# Patient Record
Sex: Female | Born: 1995
Health system: Southern US, Community
[De-identification: ages and names within clinical notes are randomized; demographics above are authoritative.]

## PROBLEM LIST (undated history)

## (undated) DIAGNOSIS — R87629 Unspecified abnormal cytological findings in specimens from vagina: Secondary | ICD-10-CM

## (undated) DIAGNOSIS — F32A Depression, unspecified: Secondary | ICD-10-CM

## (undated) DIAGNOSIS — F419 Anxiety disorder, unspecified: Secondary | ICD-10-CM

## (undated) DIAGNOSIS — Z8619 Personal history of other infectious and parasitic diseases: Secondary | ICD-10-CM

## (undated) HISTORY — DX: Personal history of other infectious and parasitic diseases: Z86.19

## (undated) HISTORY — DX: Anxiety disorder, unspecified: F41.9

## (undated) HISTORY — DX: Depression, unspecified: F32.A

## (undated) HISTORY — DX: Unspecified abnormal cytological findings in specimens from vagina: R87.629

## (undated) HISTORY — PX: OTHER SURGICAL HISTORY: SHX169

---
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
## 2014-02-22 LAB — BASIC METABOLIC PANEL
BUN: 8 mg/dL (ref 4–21)
Creatinine: 0.8 mg/dL (ref 0.5–1.1)
GLUCOSE: 93 mg/dL
POTASSIUM: 4.2 mmol/L (ref 3.4–5.3)
SODIUM: 139 mmol/L (ref 137–147)

## 2014-02-22 LAB — TSH: TSH: 1.82 u[IU]/mL (ref 0.41–5.90)

## 2014-02-22 LAB — CBC AND DIFFERENTIAL
HCT: 40 % (ref 36–46)
Hemoglobin: 14.2 g/dL (ref 12.0–16.0)
Platelets: 175 10*3/uL (ref 150–399)
WBC: 13.5 10^3/mL

## 2014-02-22 LAB — HEPATIC FUNCTION PANEL
ALT: 6 U/L — AB (ref 7–35)
AST: 14 U/L (ref 13–35)

## 2015-03-25 ENCOUNTER — Other Ambulatory Visit: Payer: Self-pay | Admitting: Family Medicine

## 2015-03-26 NOTE — Telephone Encounter (Signed)
Please call in alprazolam. Patient is due for follow up o.v. And needs to schedule before we can approve any additional refills.

## 2015-03-26 NOTE — Telephone Encounter (Signed)
Rx faxed to pharmacy  

## 2015-04-17 ENCOUNTER — Encounter: Payer: Self-pay | Admitting: Family Medicine

## 2015-04-17 ENCOUNTER — Ambulatory Visit (INDEPENDENT_AMBULATORY_CARE_PROVIDER_SITE_OTHER): Payer: Managed Care, Other (non HMO) | Admitting: Family Medicine

## 2015-04-17 VITALS — BP 122/70 | HR 87 | Temp 98.0°F | Resp 16 | Wt 148.0 lb

## 2015-04-17 DIAGNOSIS — J309 Allergic rhinitis, unspecified: Secondary | ICD-10-CM

## 2015-04-17 DIAGNOSIS — J029 Acute pharyngitis, unspecified: Secondary | ICD-10-CM

## 2015-04-17 DIAGNOSIS — R509 Fever, unspecified: Secondary | ICD-10-CM | POA: Diagnosis not present

## 2015-04-17 DIAGNOSIS — F419 Anxiety disorder, unspecified: Secondary | ICD-10-CM | POA: Insufficient documentation

## 2015-04-17 DIAGNOSIS — F410 Panic disorder [episodic paroxysmal anxiety] without agoraphobia: Secondary | ICD-10-CM | POA: Insufficient documentation

## 2015-04-17 DIAGNOSIS — J018 Other acute sinusitis: Secondary | ICD-10-CM

## 2015-04-17 LAB — POCT INFLUENZA A/B
Influenza A, POC: NEGATIVE
Influenza B, POC: NEGATIVE

## 2015-04-17 LAB — POCT RAPID STREP A (OFFICE): Rapid Strep A Screen: NEGATIVE

## 2015-04-17 MED ORDER — HYDROCODONE-HOMATROPINE 5-1.5 MG/5ML PO SYRP: 5.0000 mL | ORAL_SOLUTION | Freq: Three times a day (TID) | ORAL | Status: DC | PRN

## 2015-04-17 MED ORDER — AMOXICILLIN 500 MG PO CAPS: 1000.0000 mg | ORAL_CAPSULE | Freq: Two times a day (BID) | ORAL | Status: AC

## 2015-04-17 MED ORDER — FLUTICASONE PROPIONATE 50 MCG/ACT NA SUSP: 2.0000 | Freq: Every day | NASAL | Status: DC

## 2015-04-17 NOTE — Progress Notes (Signed)
Patient: Michele Hunt Female    DOB: 06/01/1996   19 y.o.   MRN: [id number] Visit Date: 04/17/2015  Today's Provider: Mila Merryonald Seniah Lawrence, MD   Chief Complaint  Patient presents with  . Cough   Subjective:    Sinusitis This is a new problem. The current episode started 1 to 4 weeks ago. The problem has been gradually worsening since onset. The maximum temperature recorded prior to her arrival was 100.4 - 100.9 F. The fever has been present for 5 days or more. The pain is moderate. Associated symptoms include chills, congestion, coughing, ear pain, headaches, a hoarse voice, neck pain, shortness of breath, sinus pressure, sneezing, a sore throat and swollen glands. Past treatments include acetaminophen and oral decongestants. The treatment provided mild relief.      Allergies  Allergen Reactions  . Sertraline Hcl     upset stomach   Previous Medications   ALPRAZOLAM (XANAX) 0.5 MG TABLET    Take 1 tablet (0.5 mg total) by mouth at bedtime as needed (for panic attack).    Review of Systems  Constitutional: Positive for fever and chills.  HENT: Positive for congestion, ear discharge, ear pain, hoarse voice, sinus pressure, sneezing and sore throat.   Respiratory: Positive for cough, shortness of breath and wheezing.   Musculoskeletal: Positive for neck pain.  Neurological: Positive for headaches.    Social History  Substance Use Topics  . Smoking status: Current Every Day Smoker -- 0.75 packs/day    Types: Cigarettes  . Smokeless tobacco: Not on file  . Alcohol Use: 0.0 oz/week    0 Standard drinks or equivalent per week     Comment: occasional   Objective:   BP 122/70 mmHg  Pulse 87  Temp(Src) 98 F (36.7 C) (Oral)  Resp 16  Wt 148 lb (67.132 kg)  SpO2 98%  LMP 03/18/2015  Physical Exam  General Appearance:    Alert, cooperative, no distress  HENT:   bilateral TM normal without fluid or infection, neck has bilateral anterior cervical nodes enlarged, pharynx  erythematous without exudate and maxillary sinus tender  Eyes:    PERRL, conjunctiva/corneas clear, EOM's intact       Lungs:     Clear to auscultation bilaterally, respirations unlabored  Heart:    Regular rate and rhythm  Neurologic:   Awake, alert, oriented x 3. No apparent focal neurological           defect.       Results for orders placed or performed in visit on 04/17/15  POCT Influenza A/B  Result Value Ref Range   Influenza A, POC Negative Negative   Influenza B, POC Negative Negative  POCT rapid strep A  Result Value Ref Range   Rapid Strep A Screen Negative Negative       Assessment & Plan:     1. Fever, unspecified fever cause  - POCT Influenza A/B - POCT rapid strep A  2. Sore throat  - POCT Influenza A/B - POCT rapid strep A  3. Other acute sinusitis  - amoxicillin (AMOXIL) 500 MG capsule; Take 2 capsules (1,000 mg total) by mouth 2 (two) times daily.  Dispense: 40 capsule; Refill: 0 - HYDROcodone-homatropine (HYCODAN) 5-1.5 MG/5ML syrup; Take 5 mLs by mouth every 8 (eight) hours as needed for cough.  Dispense: 120 mL; Refill: 0 - fluticasone (FLONASE) 50 MCG/ACT nasal spray; Place 2 sprays into both nostrils daily.  Dispense: 16 g; Refill: 6  4. Allergic rhinitis, unspecified allergic rhinitis type         Mila Merry, MD  [facility]

## 2015-05-01 ENCOUNTER — Encounter: Payer: Self-pay | Admitting: Family Medicine

## 2015-05-01 ENCOUNTER — Ambulatory Visit (INDEPENDENT_AMBULATORY_CARE_PROVIDER_SITE_OTHER): Payer: Managed Care, Other (non HMO) | Admitting: Family Medicine

## 2015-05-01 VITALS — BP 120/70 | HR 86 | Temp 98.0°F | Resp 16 | Wt 150.0 lb

## 2015-05-01 DIAGNOSIS — J029 Acute pharyngitis, unspecified: Secondary | ICD-10-CM | POA: Diagnosis not present

## 2015-05-01 DIAGNOSIS — J309 Allergic rhinitis, unspecified: Secondary | ICD-10-CM

## 2015-05-01 MED ORDER — MONTELUKAST SODIUM 10 MG PO TABS: 10.0000 mg | ORAL_TABLET | Freq: Every day | ORAL | Status: DC

## 2015-05-01 NOTE — Progress Notes (Signed)
Patient: Michele HummingbirdMary E Bowman Female    DOB: [birth year]   19 y.o.   MRN: [id number] Visit Date: 05/01/2015  Today's Provider: Mila Merryonald Elford Evilsizer, MD   Chief Complaint  Patient presents with  . Sore Throat   Subjective:    Sore Throat  This is a recurrent problem. The current episode started 1 to 4 weeks ago. The pain is worse on the left side. The maximum temperature recorded prior to her arrival was 101 - 101.9 F. Fever duration: had fever on and off. The pain is at a severity of 7/10. The pain is moderate. Associated symptoms include congestion, coughing, diarrhea, ear discharge, a hoarse voice, neck pain, shortness of breath, swollen glands and trouble swallowing. Pertinent negatives include no abdominal pain, ear pain, headaches or vomiting. She has tried acetaminophen for the symptoms.   She was seen for similar symptoms 2 weeks and treated with amoxicillin. She states the sore throat improved considerable by the time she finished the antibiotic.    No Known Allergies Previous Medications   ALPRAZOLAM (XANAX) 0.5 MG TABLET    Take 1 tablet (0.5 mg total) by mouth at bedtime as needed (for panic attack).   FLUTICASONE (FLONASE) 50 MCG/ACT NASAL SPRAY    Place 2 sprays into both nostrils daily.   HYDROCODONE-HOMATROPINE (HYCODAN) 5-1.5 MG/5ML SYRUP    Take 5 mLs by mouth every 8 (eight) hours as needed for cough.    Review of Systems  HENT: Positive for congestion, ear discharge, hoarse voice, rhinorrhea, sinus pressure, sore throat and trouble swallowing. Negative for ear pain.   Eyes: Positive for discharge.  Respiratory: Positive for cough and shortness of breath.   Gastrointestinal: Positive for diarrhea. Negative for vomiting and abdominal pain.  Musculoskeletal: Positive for neck pain.  Neurological: Negative for headaches.    Social History  Substance Use Topics  . Smoking status: Current Every Day Smoker -- 0.75 packs/day    Types: Cigarettes  . Smokeless tobacco: Not on  file  . Alcohol Use: 0.0 oz/week    0 Standard drinks or equivalent per week     Comment: occasional   Objective:   BP 120/70 mmHg  Pulse 86  Temp(Src) 98 F (36.7 C) (Oral)  Resp 16  Wt 150 lb (68.04 kg)  SpO2 98%  LMP 04/29/2015  Physical Exam  General Appearance:    Alert, cooperative, no distress  HENT:   bilateral TM normal without fluid or infection, neck has bilateral anterior cervical nodes enlarged, sinuses nontender, post nasal drip noted and nasal mucosa pale and congested  Eyes:    PERRL, conjunctiva/corneas clear, EOM's intact       Lungs:     Clear to auscultation bilaterally, respirations unlabored  Heart:    Regular rate and rhythm  Neurologic:   Awake, alert, oriented x 3. No apparent focal neurological           defect.           Assessment & Plan:     1. Sore throat  - CBC w/Diff/Platelet - Monospot  2. Allergic rhinitis, unspecified allergic rhinitis type  - montelukast (SINGULAIR) 10 MG tablet; Take 1 tablet (10 mg total) by mouth at bedtime.  Dispense: 30 tablet; Refill: 3  Patient Instructions  Take Claritin one tablet daily for allergies May also take OTC Delsym or Mucinex for cough         Mila Merryonald Adelene Polivka, MD  [facility]

## 2015-05-01 NOTE — Patient Instructions (Signed)
Take Claritin one tablet daily for allergies May also take OTC Delsym or Mucinex for cough

## 2015-05-02 LAB — CBC WITH DIFFERENTIAL/PLATELET
BASOS ABS: 0 10*3/uL (ref 0.0–0.2)
Basos: 0 %
EOS (ABSOLUTE): 0.1 10*3/uL (ref 0.0–0.4)
Eos: 2 %
HEMOGLOBIN: 13.6 g/dL (ref 11.1–15.9)
Hematocrit: 39.8 % (ref 34.0–46.6)
IMMATURE GRANS (ABS): 0 10*3/uL (ref 0.0–0.1)
Immature Granulocytes: 0 %
Lymphocytes Absolute: 1.7 10*3/uL (ref 0.7–3.1)
Lymphs: 20 %
MCH: 31.1 pg (ref 26.6–33.0)
MCHC: 34.2 g/dL (ref 31.5–35.7)
MCV: 91 fL (ref 79–97)
MONOCYTES: 10 %
Monocytes Absolute: 0.8 10*3/uL (ref 0.1–0.9)
Neutrophils Absolute: 5.7 10*3/uL (ref 1.4–7.0)
Neutrophils: 68 %
Platelets: 178 10*3/uL (ref 150–379)
RBC: 4.37 x10E6/uL (ref 3.77–5.28)
RDW: 12.7 % (ref 12.3–15.4)
WBC: 8.4 10*3/uL (ref 3.4–10.8)

## 2015-05-02 LAB — MONONUCLEOSIS SCREEN: MONO SCREEN: NEGATIVE

## 2015-05-07 ENCOUNTER — Telehealth: Payer: Self-pay | Admitting: Family Medicine

## 2015-07-17 ENCOUNTER — Encounter: Payer: Self-pay | Admitting: Family Medicine

## 2015-07-17 ENCOUNTER — Ambulatory Visit (INDEPENDENT_AMBULATORY_CARE_PROVIDER_SITE_OTHER): Payer: Managed Care, Other (non HMO) | Admitting: Family Medicine

## 2015-07-17 VITALS — BP 108/66 | HR 98 | Temp 98.4°F | Resp 16 | Wt 148.0 lb

## 2015-07-17 DIAGNOSIS — L301 Dyshidrosis [pompholyx]: Secondary | ICD-10-CM | POA: Diagnosis not present

## 2015-07-17 MED ORDER — TRIAMCINOLONE ACETONIDE 0.1 % EX CREA: 1.0000 "application " | TOPICAL_CREAM | Freq: Two times a day (BID) | CUTANEOUS | Status: DC | PRN

## 2015-07-17 NOTE — Progress Notes (Signed)
       Patient: Michele Hunt Female    DOB: 12/02/1995   20 y.o.   MRN: [id number] Visit Date: 07/17/2015  Today's Provider: Mila Merry, MD   Chief Complaint  Patient presents with  . Rash    x 4 months   Subjective:    Rash This is a new problem. Episode onset: first appeared 4 months ago. The problem is unchanged (has occured intermittently for the past 4 months). The affected locations include the right hip (between finger of both hands; worse on left hand). The rash is characterized by redness, burning, dryness, itchiness and swelling. 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 Pertinent negatives include no anorexia, congestion, cough, diarrhea, eye pain, facial edema, fatigue, fever, nail changes, rhinorrhea, shortness of breath, sore throat or vomiting. Treatments tried: lotions and OTC creams. The treatment provided no relief.       No Known Allergies Previous Medications   ALPRAZOLAM (XANAX) 0.5 MG TABLET    Take 1 tablet (0.5 mg total) by mouth at bedtime as needed (for panic attack).    Review of Systems  Constitutional: Negative for fever, chills, appetite change and fatigue.  HENT: Negative for congestion, rhinorrhea and sore throat.   Eyes: Negative for pain.  Respiratory: Negative for cough, chest tightness and shortness of breath.   Cardiovascular: Negative for chest pain and palpitations.  Gastrointestinal: Negative for nausea, vomiting, abdominal pain, diarrhea and anorexia.  Skin: Positive for rash. Negative for nail changes.  Neurological: Negative for dizziness and weakness.    Social History  Substance Use Topics  . Smoking status: Current Every Day Smoker -- 0.75 packs/day    Types: Cigarettes  . Smokeless tobacco: Not on file  . Alcohol Use: 0.0 oz/week    0 Standard drinks or equivalent per week     Comment: occasional   Objective:   BP  108/66 mmHg  Pulse 98  Temp(Src) 98.4 F (36.9 C) (Oral)  Resp 16  Wt 148 lb (67.132 kg)  SpO2 98%  Physical Exam  Skin:  Red scaly rash along sides of fingers of both hands consistent with dyshidrotic eczema    Assessment & Plan:     1. Dyshidrotic eczema  - triamcinolone cream (KENALOG) 0.1 %; Apply 1 application topically 2 (two) times daily as needed.  Dispense: 30 g; Refill: 3        Mila Merry, MD  [facility]

## 2016-01-28 NOTE — Telephone Encounter (Signed)
error 

## 2016-09-17 DIAGNOSIS — L700 Acne vulgaris: Secondary | ICD-10-CM | POA: Diagnosis not present

## 2017-02-26 ENCOUNTER — Encounter: Payer: Self-pay | Admitting: Physician Assistant

## 2017-02-26 ENCOUNTER — Ambulatory Visit (INDEPENDENT_AMBULATORY_CARE_PROVIDER_SITE_OTHER): Payer: BLUE CROSS/BLUE SHIELD | Admitting: Physician Assistant

## 2017-02-26 VITALS — BP 102/60 | HR 63 | Temp 98.4°F | Resp 16 | Wt 129.0 lb

## 2017-02-26 DIAGNOSIS — R300 Dysuria: Secondary | ICD-10-CM | POA: Diagnosis not present

## 2017-02-26 DIAGNOSIS — Z113 Encounter for screening for infections with a predominantly sexual mode of transmission: Secondary | ICD-10-CM

## 2017-02-26 DIAGNOSIS — R1084 Generalized abdominal pain: Secondary | ICD-10-CM | POA: Diagnosis not present

## 2017-02-26 DIAGNOSIS — F419 Anxiety disorder, unspecified: Secondary | ICD-10-CM | POA: Diagnosis not present

## 2017-02-26 DIAGNOSIS — K120 Recurrent oral aphthae: Secondary | ICD-10-CM

## 2017-02-26 LAB — POCT URINALYSIS DIPSTICK
Bilirubin, UA: NEGATIVE
Blood, UA: NEGATIVE
Glucose, UA: NEGATIVE
Ketones, UA: NEGATIVE
Leukocytes, UA: NEGATIVE
Nitrite, UA: NEGATIVE
Protein, UA: NEGATIVE
Spec Grav, UA: 1.015 (ref 1.010–1.025)
Urobilinogen, UA: 0.2 E.U./dL
pH, UA: 7.5 (ref 5.0–8.0)

## 2017-02-26 LAB — POCT URINE PREGNANCY: Preg Test, Ur: NEGATIVE

## 2017-02-26 MED ORDER — ALPRAZOLAM 0.5 MG PO TABS: 0.5000 mg | ORAL_TABLET | Freq: Every evening | ORAL | 0 refills | Status: DC | PRN

## 2017-02-26 MED ORDER — MAGIC MOUTHWASH W/LIDOCAINE: 5.0000 mL | Freq: Three times a day (TID) | ORAL | 0 refills | Status: DC | PRN

## 2017-02-26 NOTE — Progress Notes (Signed)
Patient: Michele ChessmanMary E Louissaint Female    DOB: 04/07/1996   21 y.o.   MRN: [id number] Visit Date: 02/26/2017  Today's Provider: Margaretann LovelessJennifer M Mardell Cragg, PA-C   Chief Complaint  Patient presents with  . Abdominal Pain  . Mouth Lesions   Subjective:    Abdominal Pain  This is a new problem. The current episode started 1 to 4 weeks ago (2 weeks ago). The onset quality is gradual. The problem occurs daily. The problem has been gradually worsening. The pain is located in the LLQ and RLQ. The pain is at a severity of 6/10. The pain is moderate. The quality of the pain is aching. The abdominal pain does not radiate. Associated symptoms include dysuria and weight loss (in the past two months. She reports that she was 140 lbs. ). Pertinent negatives include no constipation, diarrhea, fever, hematuria, nausea or vomiting. Exacerbated by: sexual intercourse and when she puts pressure to urinate. The pain is relieved by being still. Treatments tried: Azo and UTI pill. The treatment provided no relief. her sister has IBS and Paternal grandmother had colon cacncer and mgrandmother- diverticulitis   Patient also with c/o sores under tongue.She notice the sores at the beginning of the week. They are painful. It hurts to talk, and eat. Top of her mouth is sore.    No Known Allergies   Current Outpatient Prescriptions:  .  ALPRAZolam (XANAX) 0.5 MG tablet, Take 1 tablet (0.5 mg total) by mouth at bedtime as needed (for panic attack). (Patient not taking: Reported on 02/26/2017), Disp: 30 tablet, Rfl: 0 .  triamcinolone cream (KENALOG) 0.1 %, Apply 1 application topically 2 (two) times daily as needed. (Patient not taking: Reported on 02/26/2017), Disp: 30 g, Rfl: 3  Review of Systems  Constitutional: Positive for unexpected weight change and weight loss (in the past two months. She reports that she was 140 lbs. ). Negative for appetite change, diaphoresis, fatigue and fever.  HENT: Negative.   Respiratory:  Negative.   Cardiovascular: Negative.   Gastrointestinal: Positive for abdominal pain. Negative for constipation, diarrhea, nausea and vomiting.  Genitourinary: Positive for dyspareunia (once) and dysuria. Negative for decreased urine volume, flank pain, genital sores, hematuria, menstrual problem, pelvic pain, urgency, vaginal bleeding, vaginal discharge and vaginal pain.  Musculoskeletal: Negative.   Neurological: Negative.     Social History  Substance Use Topics  . Smoking status: Current Every Day Smoker    Packs/day: 0.75    Types: Cigarettes  . Smokeless tobacco: Never Used  . Alcohol use 0.0 oz/week     Comment: occasional   Objective:   BP 102/60 (BP Location: Right Arm, Patient Position: Sitting, Cuff Size: Normal)   Pulse 63   Temp 98.4 F (36.9 C) (Oral)   Resp 16   Wt 129 lb (58.5 kg)   LMP 01/25/2017 (Approximate)    Physical Exam  Constitutional: She is oriented to person, place, and time. She appears well-developed and well-nourished. No distress.  HENT:  Head: Normocephalic and atraumatic.  Right Ear: Hearing, tympanic membrane, external ear and ear canal normal.  Left Ear: Hearing, tympanic membrane, external ear and ear canal normal.  Nose: Nose normal.  Mouth/Throat: Uvula is midline, oropharynx is clear and moist and mucous membranes are normal. Oral lesions (white patch with erythematous border under tongue) present. No oropharyngeal exudate.  Eyes: Pupils are equal, round, and reactive to light. Conjunctivae are normal. Right eye exhibits no discharge. Left eye exhibits no  discharge. No scleral icterus.  Neck: Normal range of motion. Neck supple.  Cardiovascular: Normal rate, regular rhythm and normal heart sounds.  Exam reveals no gallop and no friction rub.   No murmur heard. Pulmonary/Chest: Effort normal and breath sounds normal. No respiratory distress. She has no wheezes. She has no rales.  Abdominal: Soft. Normal appearance and bowel sounds are  normal. She exhibits no distension and no mass. There is no hepatosplenomegaly. There is generalized tenderness. There is no rebound, no guarding and no CVA tenderness.  Lymphadenopathy:    She has no cervical adenopathy.  Neurological: She is alert and oriented to person, place, and time.  Skin: Skin is warm and dry. She is not diaphoretic.  Psychiatric: Her speech is normal and behavior is normal. Judgment and thought content normal. Her mood appears anxious. Cognition and memory are normal.  Vitals reviewed.     Assessment & Plan:     1. Generalized abdominal pain DDx: IBS, STD/PID, ovarian cyst, fibroid, endometriosis. Patient reports increased stress and has done well with xanax in the past. She does have family history of IBS in a sister. Will refill her xanax as below for her to use at bedtime. She is to call if symptoms worsen.  - ALPRAZolam (XANAX) 0.5 MG tablet; Take 1 tablet (0.5 mg total) by mouth at bedtime as needed (for panic attack).  Dispense: 30 tablet; Refill: 0  2. Anxiety See above medical treatment plan. - ALPRAZolam (XANAX) 0.5 MG tablet; Take 1 tablet (0.5 mg total) by mouth at bedtime as needed (for panic attack).  Dispense: 30 tablet; Refill: 0  3. Aphthous ulcer Magic mouthwash given as below to treat. She is to call if it does not improve.  - magic mouthwash w/lidocaine SOLN; Take 5 mLs by mouth 3 (three) times daily as needed for mouth pain.  Dispense: 100 mL; Refill: 0  4. Screen for STD (sexually transmitted disease) Nuswab collected to r/o STD as source of pelvic discomfort. Exam today was unremarkable and no indication of STD.  - NuSwab Vaginitis Plus (VG+)  5. Dysuria UA was negative. Urine pregnancy negative.  - POCT urinalysis dipstick - POCT urine pregnancy       Margaretann Loveless, PA-C  [facility]

## 2017-02-26 NOTE — Patient Instructions (Signed)
Dysphoria Dysphoria is a condition in which a person feels unpleasant or uncomfortable. It may involve mood changes and feelings of sadness, anxiety, irritability, and restlessness. Dysphoria is often caused by normal life stress and it usually goes away within several days. Dysphoria that lasts longer than several days may be a symptom of a mental disorder, such as major depression or bipolar disorder. Follow these instructions at home: Monitor your mood for any changes. Take these steps to help with your discomfort and unpleasant feelings:  Take over-the-counter and prescription medicines only as told by your health care provider.  Check with your health care provider before taking any herbs or supplements.  Keep all follow-up visits as told by your health care provider.This is important.  Maintain a healthy lifestyle. ? Eat a healthy diet. ? Exercise regularly. ? Get plenty of sleep.  Avoid alcohol and drugs.  Learn ways to reduce stress and cope with stress, such as with yoga and meditation.  Talk about your feelings with family members or health care providers.  Make time for yourself to do things that you enjoy.  Contact a health care provider if:  You were given medicine and it does not seem to be helping.  You feel hopeless and overwhelmed.  You feel like you cannot leave your house.  You have trouble taking care of yourself. Get help right away if:  You have serious thoughts about hurting yourself or others. This information is not intended to replace advice given to you by your health care provider. Make sure you discuss any questions you have with your health care provider. Document Released: 11/17/2005 Document Revised: 11/14/2015 Document Reviewed: 04/21/2014 Elsevier Interactive Patient Education  2018 Elsevier Inc. Canker Sores Canker sores are small, painful sores that develop inside your mouth. They may also be called aphthous ulcers. You can get canker  sores on the inside of your lips or cheeks, on your tongue, or anywhere inside your mouth. You can have just one canker sore or several of them. Canker sores cannot be passed from one person to another (noncontagious). These sores are different than the sores that you may get on the outside of your lips (cold sores or fever blisters). Canker sores usually start as painful red bumps. Then they turn into small white, yellow, or gray ulcers that have red borders. The ulcers may be quite painful. The pain may be worse when you eat or drink. What are the causes? The cause of this condition is not known. What increases the risk? This condition is more likely to develop in:  Women.  People in their teens or 90s.  Women who are having their menstrual period.  People who are under a lot of emotional stress.  People who do not get enough iron or B vitamins.  People who have poor oral hygiene.  People who have an injury inside the mouth. This can happen after having dental work or from chewing something hard.  What are the signs or symptoms? Along with the canker sore, symptoms may also include:  Fever.  Fatigue.  Swollen lymph nodes in your neck.  How is this diagnosed? This condition can be diagnosed based on your symptoms. Your health care provider will also examine your mouth. Your health care provider may also do tests if you get canker sores often or if they are very bad. Tests may include:  Blood tests to rule out other causes of canker sores.  Taking swabs from the sore to check for  infection.  Taking a small piece of skin from the sore (biopsy) to test it for cancer.  How is this treated? Most canker sores clear up without treatment in about 10 days. Home care is usually the only treatment that you will need. Over-the-counter medicines can relieve discomfort.If you have severe canker sores, your health care provider may prescribe:  Numbing ointment to relieve  pain.  Vitamins.  Steroid medicines. These may be given as: ? Oral pills. ? Mouth rinses. ? Gels.  Antibiotic mouth rinse.  Follow these instructions at home:  Apply, take, or use medicines only as directed by your health care provider. These include vitamins.  If you were prescribed an antibiotic mouth rinse, finish all of it even if you start to feel better.  Until the sores are healed: ? Do not drink coffee or citrus juices. ? Do not eat spicy or salty foods.  Use a mild, over-the-counter mouth rinse as directed by your health care provider.  Practice good oral hygiene. ? Floss your teeth every day. ? Brush your teeth with a soft brush twice each day. Contact a health care provider if:  Your symptoms do not get better after two weeks.  You also have a fever or swollen glands.  You get canker sores often.  You have a canker sore that is getting larger.  You cannot eat or drink due to your canker sores. This information is not intended to replace advice given to you by your health care provider. Make sure you discuss any questions you have with your health care provider. Document Released: 10/03/2010 Document Revised: 11/14/2015 Document Reviewed: 05/09/2014 Elsevier Interactive Patient Education  [employer]2018 Elsevier Inc[employer]

## 2017-03-03 ENCOUNTER — Telehealth: Payer: Self-pay

## 2017-03-03 LAB — NUSWAB VAGINITIS PLUS (VG+)
CANDIDA ALBICANS, NAA: NEGATIVE
CANDIDA GLABRATA, NAA: NEGATIVE
CHLAMYDIA TRACHOMATIS, NAA: NEGATIVE
Neisseria gonorrhoeae, NAA: NEGATIVE
TRICH VAG BY NAA: NEGATIVE

## 2017-03-03 NOTE — Telephone Encounter (Signed)
[id number] 

## 2017-03-03 NOTE — Telephone Encounter (Signed)
Patient was advised. KW 

## 2017-03-03 NOTE — Telephone Encounter (Signed)
-----   Message from Margaretann LovelessJennifer M Burnette, [place]PA-C sent at 03/03/2017  9:24 AM EDT ----- NuSwab is completely negative. No BV, no yeast, no GC, chlamydia or trich noted.

## 2017-03-25 DIAGNOSIS — L700 Acne vulgaris: Secondary | ICD-10-CM | POA: Diagnosis not present

## 2017-03-31 ENCOUNTER — Other Ambulatory Visit: Payer: Self-pay | Admitting: Family Medicine

## 2017-03-31 DIAGNOSIS — Z01419 Encounter for gynecological examination (general) (routine) without abnormal findings: Secondary | ICD-10-CM | POA: Diagnosis not present

## 2017-03-31 DIAGNOSIS — N970 Female infertility associated with anovulation: Secondary | ICD-10-CM | POA: Diagnosis not present

## 2017-03-31 DIAGNOSIS — Z124 Encounter for screening for malignant neoplasm of cervix: Secondary | ICD-10-CM | POA: Diagnosis not present

## 2017-03-31 DIAGNOSIS — F419 Anxiety disorder, unspecified: Secondary | ICD-10-CM

## 2017-03-31 DIAGNOSIS — R1084 Generalized abdominal pain: Secondary | ICD-10-CM

## 2017-03-31 MED ORDER — ALPRAZOLAM 0.5 MG PO TABS: 0.5000 mg | ORAL_TABLET | Freq: Every evening | ORAL | 0 refills | Status: DC | PRN

## 2017-03-31 NOTE — Telephone Encounter (Signed)
Pt contacted office for refill request on the following medications:  ALPRAZolam (XANAX) 0.5 MG tablet  [facility].  [id number]#[phone]

## 2017-03-31 NOTE — Telephone Encounter (Signed)
RX called in at [facility]. Patient advised.

## 2017-03-31 NOTE — Telephone Encounter (Signed)
Last OV and RF was on 02/26/17

## 2017-03-31 NOTE — Telephone Encounter (Signed)
Please call in alprazolam 0.5mg  to take 0.5-1 tab PO q hs prn anxiety #30 NR

## 2017-04-15 ENCOUNTER — Other Ambulatory Visit: Payer: Self-pay | Admitting: Family Medicine

## 2017-04-15 DIAGNOSIS — R1084 Generalized abdominal pain: Secondary | ICD-10-CM

## 2017-04-15 DIAGNOSIS — F419 Anxiety disorder, unspecified: Secondary | ICD-10-CM

## 2017-04-15 NOTE — Telephone Encounter (Signed)
Please advise 

## 2017-04-15 NOTE — Telephone Encounter (Signed)
Pt contacted office for refill request on the following medications:  ALPRAZolam (XANAX) 0.5 MG tablet   Pt states she is taking 2 a day and is requesting the quantity increased.  [facility].  WU#[phone]/[id number]#[phone]/[id number]

## 2017-04-15 NOTE — Telephone Encounter (Signed)
Please Review.  Thanks,  -Joseline 

## 2017-04-15 NOTE — Telephone Encounter (Signed)
I have only seen her acutely for abdominal pain. Normally sees Radiographer, therapeuticisher. She is requesting Xanax early per records. Was last called in on 03/31/17. Only supposed to be taking one at bedtime prn.

## 2017-04-16 DIAGNOSIS — N390 Urinary tract infection, site not specified: Secondary | ICD-10-CM | POA: Diagnosis not present

## 2017-04-16 MED ORDER — ALPRAZOLAM 0.5 MG PO TABS: 0.5000 mg | ORAL_TABLET | Freq: Two times a day (BID) | ORAL | 4 refills | Status: DC | PRN

## 2017-04-16 NOTE — Telephone Encounter (Signed)
Please call in alprazolam.  

## 2017-04-16 NOTE — Telephone Encounter (Signed)
Pt is calling to see if she is going to be able to get this medication or if she needs an OV. Pt stated that she has been taking the ALPRAZolam (XANAX) 0.5 MG tablet once in the morning and once at night. Pt stated that it helps with her IBS and she has a lot of personal things going on. Pt stated she is out of the medication. Please advise. Thanks TNP

## 2017-04-16 NOTE — Telephone Encounter (Signed)
Rx called in to pharmacy. 

## 2017-04-21 DIAGNOSIS — B370 Candidal stomatitis: Secondary | ICD-10-CM | POA: Diagnosis not present

## 2017-04-22 ENCOUNTER — Encounter: Payer: Self-pay | Admitting: Family Medicine

## 2017-04-22 ENCOUNTER — Ambulatory Visit (INDEPENDENT_AMBULATORY_CARE_PROVIDER_SITE_OTHER): Payer: BLUE CROSS/BLUE SHIELD | Admitting: Family Medicine

## 2017-04-22 VITALS — BP 130/80 | HR 90 | Temp 98.7°F | Resp 16 | Wt 127.2 lb

## 2017-04-22 DIAGNOSIS — B370 Candidal stomatitis: Secondary | ICD-10-CM

## 2017-04-22 DIAGNOSIS — M545 Low back pain, unspecified: Secondary | ICD-10-CM

## 2017-04-22 DIAGNOSIS — K5900 Constipation, unspecified: Secondary | ICD-10-CM | POA: Diagnosis not present

## 2017-04-22 LAB — POCT URINALYSIS DIPSTICK
BILIRUBIN UA: NEGATIVE
Glucose, UA: NEGATIVE
Leukocytes, UA: NEGATIVE
NITRITE UA: NEGATIVE
PH UA: 6 (ref 5.0–8.0)
UROBILINOGEN UA: 1 U/dL

## 2017-04-22 MED ORDER — FLUCONAZOLE 100 MG PO TABS: ORAL_TABLET | ORAL | 0 refills | Status: DC

## 2017-04-22 NOTE — Progress Notes (Signed)
Subjective:     Patient ID: Michele Hunt, female   DOB: 1995-07-29, 21 y.o.   MRN: [id number]  HPI  Chief Complaint  Patient presents with  . Follow-up    Patient returns to our office for follow up after being seen at urgent care on 04/21/17 with complaints of blisters in her mouth. Patient was diagnosed with thrust and presribed Nystatin mouth rinse.  . Back Pain    Patient reports that a week ago she was seen and treated at urgent care for UTI and was prescribed sulfamethazole, patient reports today lower back pain and cloudy urine  . Constipation    Patient reports that she has not had a bowel movement in 4 days.   Also was prescribed 5 days of fluconazole but has only had one dose. Reports throat is also becoming painful. States urinary sx have resolved. States normal bowel pattern is 3 x day.   Review of Systems     Objective:   Physical Exam  Constitutional: She appears well-developed and well-nourished. She appears distressed (frustrated with oral pain and not getting better.).  HENT:  White coated tongue with white buccal and sublingual patches.  Abdominal: Soft. There is no tenderness.  Lymphadenopathy:    She has cervical adenopathy (bilateral anterior cervical).       Assessment:    1. Acute bilateral low back pain without sciatica - POCT urinalysis dipstick  2. Constipation, unspecified constipation typ  3. Oropharyngeal candidiasis - fluconazole (DIFLUCAN) 100 MG tablet; Take two pills then one pill daily for two weeks.  Dispense: 15 tablet; Refill: 0    Plan:    Discussed use of Dulcolax supps. Rx for MVLB mixture 90cc with refilll: swish and spit 5 ml 3-4 x day.

## 2017-04-22 NOTE — Patient Instructions (Signed)
Try a Dulcolax suppository for constipation and increase fluid intake.

## 2017-04-23 ENCOUNTER — Telehealth: Payer: Self-pay | Admitting: Family Medicine

## 2017-04-23 NOTE — Telephone Encounter (Signed)
Called pharmacist she states that prescription was written out yesterday and should have lasted patient 5 days. Sh states that prescription will not go through till the 5th. I advised pharmacist to fill prescription as patient was in agree ance to pay for medication. Patient states that she takes medication every time she eats or drinks, I advised patient that she needs to follow instructions on the bottle as directed and if she is still having a issue to call the office back. Michele Hunt

## 2017-04-23 NOTE — Telephone Encounter (Signed)
Pt is requesting a refill for the mouth wash she got yesterday.  Pt states it is helping and she is almost out.  [facility].  ZO#[phone]/WJCB#[phone]/MW

## 2017-04-23 NOTE — Telephone Encounter (Signed)
I wrote her a writtenp Rx for MVLB mixture for oral pain. This had to be prepared by the pharmacist. Please have them refill for 90 ml.

## 2017-07-19 ENCOUNTER — Ambulatory Visit (INDEPENDENT_AMBULATORY_CARE_PROVIDER_SITE_OTHER): Payer: BLUE CROSS/BLUE SHIELD | Admitting: Family Medicine

## 2017-07-19 ENCOUNTER — Encounter: Payer: Self-pay | Admitting: Family Medicine

## 2017-07-19 VITALS — BP 100/80 | HR 109 | Temp 98.1°F | Resp 16 | Wt 133.0 lb

## 2017-07-19 DIAGNOSIS — Z309 Encounter for contraceptive management, unspecified: Secondary | ICD-10-CM

## 2017-07-19 MED ORDER — MEDROXYPROGESTERONE ACETATE 150 MG/ML IM SUSP
150.0000 mg | Freq: Once | INTRAMUSCULAR | Status: AC
  Administered 2017-07-19: 150 mg via INTRAMUSCULAR

## 2017-07-19 NOTE — Progress Notes (Signed)
Patient: Michele Hunt Female    DOB: Mar 27, 1996   22 y.o.   MRN: [id number] Visit Date: 07/19/2017  Today's Provider: Mila Merryonald Unnamed Zeien, MD   No chief complaint on file.  Subjective:    HPI  Patient is here to discuss birth control. She was previously on Depot-provera for several years [facility], which was stopped 3 years ago and changed to OCPs. She states she [facility] to [facility] to get [facility] back on Depot, but her usual provider has been out sick and she so would like to go ahead and get started today. She denies any adverse effect from medication in past.   [facility]. She reports she is currently menstruating, her periods are consistent and has not missed or had abnormal period recently. She is on last week of OCP which she brings to office today to verify that she on hormonal contraception.   No Known Allergies   Current Outpatient Medications:  .  ALPRAZolam (XANAX) 0.5 MG tablet, Take 1 tablet (0.5 mg total) by mouth 2 (two) times daily as needed (for panic attack)., Disp: 30 tablet, Rfl: 4 .  fluconazole (DIFLUCAN) 100 MG tablet, Take two pills then one pill daily for two weeks., Disp: 15 tablet, Rfl: 0 .  magic mouthwash w/lidocaine SOLN, Take 5 mLs by mouth 3 (three) times daily as needed for mouth pain., Disp: 100 mL, Rfl: 0 .  norethindrone-ethinyl estradiol (MICROGESTIN,JUNEL,LOESTRIN) 1-20 MG-MCG tablet, TK 1 T PO D FOR 21 DAYS. NO DRUG ON FOR 7 DAYS., Disp: , Rfl: 0 .  nystatin (MYCOSTATIN) 100000 UNIT/ML suspension, , Disp: , Rfl: 0 .  RETIN-A MICRO PUMP 0.06 % GEL, , Disp: , Rfl: 3 .  spironolactone (ALDACTONE) 50 MG tablet, , Disp: , Rfl: 1 .  triamcinolone cream (KENALOG) 0.1 %, Apply 1 application topically 2 (two) times daily as needed., Disp: 30 g, Rfl: 3  Review of Systems  Constitutional: Negative for appetite change, chills, fatigue and fever.  Respiratory: Negative for chest tightness and shortness  of breath.   Cardiovascular: Negative for chest pain and palpitations.  Gastrointestinal: Negative for abdominal pain, nausea and vomiting.  Neurological: Negative for dizziness and weakness.    Social History   Tobacco Use  . Smoking status: Current Every Day Smoker    Packs/day: 0.75    Types: Cigarettes  . Smokeless tobacco: Never Used  Substance Use Topics  . Alcohol use: Yes    Alcohol/week: 0.0 oz    Comment: occasional   Objective:   BP 100/80 (BP Location: Right Arm, Patient Position: Sitting, Cuff Size: Normal)   Pulse (!) 109   Temp 98.1 F (36.7 C) (Oral)   Resp 16   Wt 133 lb (60.3 kg)   SpO2 99%     Physical Exam  General appearance: alert, well developed, well nourished, cooperative and in no distress Head: Normocephalic, without obvious abnormality, atraumatic Respiratory: Respirations even and unlabored, normal respiratory rate Extremities: No gross deformities Skin: Skin color, texture, turgor normal. No rashes seen  Psych: Appropriate mood and affect. Neurologic: Mental status: Alert, oriented to person, place, and time, thought content appropriate.     Assessment & Plan:     1. Encounter for contraceptive management, unspecified type Currently menstruating with normal cycles and on oral hormone contraception.  Counseled on other methods of contraception and discussed potential adverse effects of long term depot-provera therapy. She  wants to go ahead and start back on depot today and anticipated scheduling follow up at [facility] before next injection is due.  - medroxyPROGESTERone (DEPO-PROVERA) injection 150 mg       Mila Merry, MD  [facility]

## 2017-09-01 ENCOUNTER — Ambulatory Visit (INDEPENDENT_AMBULATORY_CARE_PROVIDER_SITE_OTHER): Payer: BLUE CROSS/BLUE SHIELD | Admitting: Family Medicine

## 2017-09-01 ENCOUNTER — Encounter: Payer: Self-pay | Admitting: Family Medicine

## 2017-09-01 VITALS — BP 110/70 | HR 93 | Temp 98.4°F | Resp 16

## 2017-09-01 DIAGNOSIS — J069 Acute upper respiratory infection, unspecified: Secondary | ICD-10-CM

## 2017-09-01 DIAGNOSIS — J029 Acute pharyngitis, unspecified: Secondary | ICD-10-CM

## 2017-09-01 DIAGNOSIS — R590 Localized enlarged lymph nodes: Secondary | ICD-10-CM | POA: Diagnosis not present

## 2017-09-01 DIAGNOSIS — R05 Cough: Secondary | ICD-10-CM

## 2017-09-01 DIAGNOSIS — R059 Cough, unspecified: Secondary | ICD-10-CM

## 2017-09-01 LAB — POCT INFLUENZA A/B
INFLUENZA B, POC: NEGATIVE
Influenza A, POC: NEGATIVE

## 2017-09-01 LAB — POCT RAPID STREP A (OFFICE): RAPID STREP A SCREEN: NEGATIVE

## 2017-09-01 NOTE — Patient Instructions (Addendum)
You had strep test and flu test which were both negative today    Upper Respiratory Infection, Adult Most upper respiratory infections (URIs) are caused by a virus. A URI affects the nose, throat, and upper air passages. The most common type of URI is often called "the common cold." Follow these instructions at home:  Take medicines only as told by your doctor.  Gargle warm saltwater or take cough drops to comfort your throat as told by your doctor.  Use a warm mist humidifier or inhale steam from a shower to increase air moisture. This may make it easier to breathe.  Drink enough fluid to keep your pee (urine) clear or pale yellow.  Eat soups and other clear broths.  Have a healthy diet.  Rest as needed.  Go back to work when your fever is gone or your doctor says it is okay. ? You may need to stay home longer to avoid giving your URI to others. ? You can also wear a face mask and wash your hands often to prevent spread of the virus.  Use your inhaler more if you have asthma.  Do not use any tobacco products, including cigarettes, chewing tobacco, or electronic cigarettes. If you need help quitting, ask your doctor. Contact a doctor if:  You are getting worse, not better.  Your symptoms are not helped by medicine.  You have chills.  You are getting more short of breath.  You have brown or red mucus.  You have yellow or brown discharge from your nose.  You have pain in your face, especially when you bend forward.  You have a fever.  You have puffy (swollen) neck glands.  You have pain while swallowing.  You have white areas in the back of your throat. Get help right away if:  You have very bad or constant: ? Headache. ? Ear pain. ? Pain in your forehead, behind your eyes, and over your cheekbones (sinus pain). ? Chest pain.  You have long-lasting (chronic) lung disease and any of the following: ? Wheezing. ? Long-lasting cough. ? Coughing up blood. ? A  change in your usual mucus.  You have a stiff neck.  You have changes in your: ? Vision. ? Hearing. ? Thinking. ? Mood. This information is not intended to replace advice given to you by your health care provider. Make sure you discuss any questions you have with your health care provider. Document Released: 11/25/2007 Document Revised: 02/09/2016 Document Reviewed: 09/13/2013 Elsevier Interactive Patient Education  2018 [employer]

## 2017-09-01 NOTE — Progress Notes (Signed)
Patient: Michele Hunt Female    DOB: 1996/04/30   22 y.o.   MRN: [id number] Visit Date: 09/01/2017  Today's Provider: Mila Merry, MD   Chief Complaint  Patient presents with  . URI    x 1 day   Subjective:    URI   This is a new problem. The current episode started yesterday. The problem has been gradually worsening. There has been no fever. Associated symptoms include congestion, coughing (productive with dark green sputum), headaches, sinus pain, a sore throat and wheezing. Pertinent negatives include no abdominal pain, chest pain, diarrhea, ear pain, nausea, plugged ear sensation, rhinorrhea, sneezing or vomiting. She has tried nothing for the symptoms.       No Known Allergies   Current Outpatient Medications:  .  magic mouthwash w/lidocaine SOLN, Take 5 mLs by mouth 3 (three) times daily as needed for mouth pain., Disp: 100 mL, Rfl: 0 .  norethindrone-ethinyl estradiol (MICROGESTIN,JUNEL,LOESTRIN) 1-20 MG-MCG tablet, TK 1 T PO D FOR 21 DAYS. NO DRUG ON FOR 7 DAYS., Disp: , Rfl: 0 .  nystatin (MYCOSTATIN) 100000 UNIT/ML suspension, , Disp: , Rfl: 0 .  RETIN-A MICRO PUMP 0.06 % GEL, , Disp: , Rfl: 3 .  triamcinolone cream (KENALOG) 0.1 %, Apply 1 application topically 2 (two) times daily as needed., Disp: 30 g, Rfl: 3 .  ALPRAZolam (XANAX) 0.5 MG tablet, Take 1 tablet (0.5 mg total) by mouth 2 (two) times daily as needed (for panic attack). (Patient not taking: Reported on 09/01/2017), Disp: 30 tablet, Rfl: 4 .  fluconazole (DIFLUCAN) 100 MG tablet, Take two pills then one pill daily for two weeks. (Patient not taking: Reported on 07/19/2017), Disp: 15 tablet, Rfl: 0 .  spironolactone (ALDACTONE) 50 MG tablet, , Disp: , Rfl: 1  Review of Systems  Constitutional: Negative for appetite change, chills, fatigue and fever.  HENT: Positive for congestion, sinus pressure, sinus pain and sore throat. Negative for ear pain, rhinorrhea and sneezing.   Respiratory: Positive for  cough (productive with dark green sputum), chest tightness, shortness of breath and wheezing.   Cardiovascular: Negative for chest pain and palpitations.  Gastrointestinal: Negative for abdominal pain, diarrhea, nausea and vomiting.  Neurological: Positive for headaches. Negative for dizziness and weakness.    Social History   Tobacco Use  . Smoking status: Current Every Day Smoker    Packs/day: 0.75    Types: Cigarettes  . Smokeless tobacco: Never Used  Substance Use Topics  . Alcohol use: Yes    Alcohol/week: 0.0 oz    Comment: occasional   Objective:   BP 110/70 (BP Location: Left Arm, Patient Position: Sitting, Cuff Size: Normal)   Pulse 93   Temp 98.4 F (36.9 C) (Oral)   Resp 16   SpO2 96% Comment: room air    Physical Exam  General Appearance:    Alert, cooperative, no distress  HENT:   ENT exam normal, no neck nodes or sinus tenderness, bilateral TM normal without fluid or infection, neck without nodes, pharynx erythematous without exudate and nasal mucosa pale and congested  Eyes:    PERRL, conjunctiva/corneas clear, EOM's intact       Lungs:     Clear to auscultation bilaterally, respirations unlabored  Heart:    Regular rate and rhythm  Neurologic:   Awake, alert, oriented x 3. No apparent focal neurological           defect.       Results  for orders placed or performed in visit on 09/01/17  POCT Influenza A/B  Result Value Ref Range   Influenza A, POC Negative Negative   Influenza B, POC Negative Negative  POCT rapid strep A  Result Value Ref Range   Rapid Strep A Screen Negative Negative       Assessment & Plan:     1. Upper respiratory tract infection, unspecified type Counseled regarding signs and symptoms of viral and bacterial respiratory infections. Advised to call or return for additional evaluation if she develops any sign of bacterial infection, or if current symptoms last longer than 10 days.    2. Cough  - POCT Influenza A/B  (negative)  3. Sore throat Viral. Discussed symptomatic treatment.  - POCT Influenza A/B - POCT rapid strep A (negative.   4. Lymphadenopathy of head and neck region OTC ibuprofen prn. Call if symptoms change or if not rapidly improving.           Mila Merryonald Fisher, MD  [facility]

## 2017-09-03 ENCOUNTER — Other Ambulatory Visit: Payer: Self-pay

## 2017-09-03 ENCOUNTER — Encounter: Payer: Self-pay | Admitting: Family Medicine

## 2017-09-03 ENCOUNTER — Ambulatory Visit
Admission: RE | Admit: 2017-09-03 | Discharge: 2017-09-03 | Disposition: A | Discharge status: Home / Self Care | Payer: BLUE CROSS/BLUE SHIELD | Source: Ambulatory Visit | Attending: Family Medicine | Admitting: Family Medicine

## 2017-09-03 ENCOUNTER — Ambulatory Visit (INDEPENDENT_AMBULATORY_CARE_PROVIDER_SITE_OTHER): Payer: BLUE CROSS/BLUE SHIELD | Admitting: Family Medicine

## 2017-09-03 ENCOUNTER — Telehealth: Payer: Self-pay

## 2017-09-03 VITALS — BP 110/80 | HR 97 | Temp 98.5°F | Resp 16

## 2017-09-03 DIAGNOSIS — R0602 Shortness of breath: Secondary | ICD-10-CM | POA: Diagnosis not present

## 2017-09-03 DIAGNOSIS — R05 Cough: Secondary | ICD-10-CM | POA: Diagnosis not present

## 2017-09-03 DIAGNOSIS — F419 Anxiety disorder, unspecified: Secondary | ICD-10-CM | POA: Diagnosis not present

## 2017-09-03 DIAGNOSIS — J40 Bronchitis, not specified as acute or chronic: Secondary | ICD-10-CM | POA: Diagnosis not present

## 2017-09-03 DIAGNOSIS — K589 Irritable bowel syndrome without diarrhea: Secondary | ICD-10-CM | POA: Insufficient documentation

## 2017-09-03 DIAGNOSIS — R059 Cough, unspecified: Secondary | ICD-10-CM

## 2017-09-03 MED ORDER — AZITHROMYCIN 250 MG PO TABS: ORAL_TABLET | ORAL | 0 refills | Status: AC

## 2017-09-03 MED ORDER — ALPRAZOLAM 0.5 MG PO TABS: 0.5000 mg | ORAL_TABLET | Freq: Two times a day (BID) | ORAL | 5 refills | Status: DC | PRN

## 2017-09-03 MED ORDER — PREDNISONE 20 MG PO TABS: 20.0000 mg | ORAL_TABLET | Freq: Two times a day (BID) | ORAL | 0 refills | Status: AC

## 2017-09-03 NOTE — Telephone Encounter (Signed)
Patient called complaining that she was not any better and having trouble breathing when she would get up.  She declined going to the ER. After speaking with Dr Theodis AguasFisher's team she was advised to be seen here at 10.

## 2017-09-03 NOTE — Progress Notes (Signed)
Patient: Michele Hunt Female    DOB: 1995/08/28   22 y.o.   MRN: [id number] Visit Date: 09/03/2017  Today's Provider: Mila Merryonald Bertie Simien, MD   Chief Complaint  Patient presents with  . Follow-up  . Cough   Subjective:    HPI  Upper respiratory tract infection, unspecified type From 09/01/2017-Influenza A/B test done showing negative. Rapid strep A test done showing negative. Chest x-ray ordered this morning.  Patient has chest x-ray done this morning. Patient states she now has symptoms of productive cough, diarrhea, nausea, loss of appetite, shortness of breath, chest tightness, headaches, dizziness, and chills. Patient has been taking otc mucinex, Advil and robitussin with no relief.   She also requests refill alprazolam which she takes twice a day most days, but not always. She states she has IBS symptoms of diarrhea and constipation which alprazolam helps with. She feels IBS symptoms are due to anxiety.   No Known Allergies   Current Outpatient Medications:  .  ALPRAZolam (XANAX) 0.5 MG tablet, Take 1 tablet (0.5 mg total) by mouth 2 (two) times daily as needed (for panic attack)., Disp: 30 tablet, Rfl: 4 .  norethindrone-ethinyl estradiol (MICROGESTIN,JUNEL,LOESTRIN) 1-20 MG-MCG tablet, TK 1 T PO D FOR 21 DAYS. NO DRUG ON FOR 7 DAYS., Disp: , Rfl: 0 .  nystatin (MYCOSTATIN) 100000 UNIT/ML suspension, , Disp: , Rfl: 0 .  RETIN-A MICRO PUMP 0.06 % GEL, , Disp: , Rfl: 3 .  spironolactone (ALDACTONE) 50 MG tablet, , Disp: , Rfl: 1 .  triamcinolone cream (KENALOG) 0.1 %, Apply 1 application topically 2 (two) times daily as needed., Disp: 30 g, Rfl: 3 .  fluconazole (DIFLUCAN) 100 MG tablet, Take two pills then one pill daily for two weeks. (Patient not taking: Reported on 09/03/2017), Disp: 15 tablet, Rfl: 0 .  magic mouthwash w/lidocaine SOLN, Take 5 mLs by mouth 3 (three) times daily as needed for mouth pain. (Patient not taking: Reported on 09/03/2017), Disp: 100 mL, Rfl:  0  Review of Systems  Constitutional: Positive for chills and fatigue. Negative for appetite change and fever.  HENT: Positive for sore throat.   Respiratory: Positive for cough and shortness of breath. Negative for chest tightness.   Cardiovascular: Negative for chest pain and palpitations.  Gastrointestinal: Positive for diarrhea and nausea. Negative for abdominal pain and vomiting.  Neurological: Positive for dizziness and headaches. Negative for weakness.    Social History   Tobacco Use  . Smoking status: Current Every Day Smoker    Packs/day: 0.75    Types: Cigarettes  . Smokeless tobacco: Never Used  Substance Use Topics  . Alcohol use: Yes    Alcohol/week: 0.0 oz    Comment: occasional   Objective:   BP 110/80 (BP Location: Right Arm, Patient Position: Sitting, Cuff Size: Normal)   Pulse 97   Temp 98.5 F (36.9 C) (Oral)   Resp 16   SpO2 97%  Vitals:   09/03/17 1017  BP: 110/80  Pulse: 97  Resp: 16  Temp: 98.5 F (36.9 C)  TempSrc: Oral  SpO2: 97%     Physical Exam   General Appearance:    Alert, cooperative, no distress  Eyes:    PERRL, conjunctiva/corneas clear, EOM's intact       Lungs:     Rare expiratory wheeze. No rales. Good air movement. , respirations unlabored  Heart:    Regular rate and rhythm  Neurologic:   Awake, alert, oriented x 3.  No apparent focal neurological           defect.           Assessment & Plan:     1. Bronchitis  - azithromycin (ZITHROMAX) 250 MG tablet; 2 by mouth today, then 1 daily for 4 days  Dispense: 6 tablet; Refill: 0 - predniSONE (DELTASONE) 20 MG tablet; Take 1 tablet (20 mg total) by mouth 2 (two) times daily for 5 days.  Dispense: 10 tablet; Refill: 0  Call if symptoms change or if not rapidly improving.     2. Irritable bowel syndrome, unspecified type  - ALPRAZolam (XANAX) 0.5 MG tablet; Take 1 tablet (0.5 mg total) by mouth 2 (two) times daily as needed (for panic attack).  Dispense: 30 tablet;  Refill: 5    3. Anxiety  Doing well with - ALPRAZolam (XANAX) 0.5 MG tablet; Take 1 tablet (0.5 mg total) by mouth 2 (two) times daily as needed (for panic attack).  Dispense: 30 tablet; Refill: 5 Refilled today.       Mila Merry, MD  [facility]

## 2017-10-08 DIAGNOSIS — N390 Urinary tract infection, site not specified: Secondary | ICD-10-CM | POA: Diagnosis not present

## 2017-10-08 DIAGNOSIS — Z3200 Encounter for pregnancy test, result unknown: Secondary | ICD-10-CM | POA: Diagnosis not present

## 2017-10-11 ENCOUNTER — Ambulatory Visit (INDEPENDENT_AMBULATORY_CARE_PROVIDER_SITE_OTHER): Payer: BLUE CROSS/BLUE SHIELD | Admitting: Physician Assistant

## 2017-10-11 DIAGNOSIS — Z309 Encounter for contraceptive management, unspecified: Secondary | ICD-10-CM | POA: Diagnosis not present

## 2017-10-11 MED ORDER — MEDROXYPROGESTERONE ACETATE 150 MG/ML IM SUSP
150.0000 mg | Freq: Once | INTRAMUSCULAR | Status: AC
  Administered 2017-10-11: 150 mg via INTRAMUSCULAR

## 2017-10-11 NOTE — Progress Notes (Signed)
Patient comes in today for depo injection only. Patient tolerated injection well. Advised patient to come back on or between 7/8-7/22 for next injection. Patient reports that she will call to schedule appt because she is thinking about getting the Nexplanon.

## 2017-11-29 DIAGNOSIS — N390 Urinary tract infection, site not specified: Secondary | ICD-10-CM | POA: Diagnosis not present

## 2018-01-05 ENCOUNTER — Other Ambulatory Visit: Payer: Self-pay | Admitting: Family Medicine

## 2018-01-05 DIAGNOSIS — F419 Anxiety disorder, unspecified: Secondary | ICD-10-CM

## 2018-01-05 DIAGNOSIS — K589 Irritable bowel syndrome without diarrhea: Secondary | ICD-10-CM

## 2018-02-02 ENCOUNTER — Telehealth: Payer: Self-pay | Admitting: Family Medicine

## 2018-02-02 NOTE — Telephone Encounter (Signed)
Pt  Is coming in on Friday for a depo vaccine.  Do we have them in stock?  Thanks Fortune Brandsteri

## 2018-02-03 NOTE — Telephone Encounter (Signed)
Noted  

## 2018-02-04 ENCOUNTER — Ambulatory Visit (INDEPENDENT_AMBULATORY_CARE_PROVIDER_SITE_OTHER): Payer: BLUE CROSS/BLUE SHIELD | Admitting: Family Medicine

## 2018-02-04 ENCOUNTER — Telehealth: Payer: Self-pay

## 2018-02-04 DIAGNOSIS — Z309 Encounter for contraceptive management, unspecified: Secondary | ICD-10-CM

## 2018-02-04 LAB — POCT URINE PREGNANCY: Preg Test, Ur: NEGATIVE

## 2018-02-04 MED ORDER — MEDROXYPROGESTERONE ACETATE 150 MG/ML IM SUSP
150.0000 mg | Freq: Once | INTRAMUSCULAR | Status: AC
  Administered 2018-02-04: 150 mg via INTRAMUSCULAR

## 2018-02-04 NOTE — Telephone Encounter (Signed)
L/M for patient to come in and schedule to have a pap smear done. Patient was just in for depo, and she needs to have this done either here or at GYN.

## 2018-02-04 NOTE — Patient Instructions (Signed)
Schedule next shot on or between 04/22/18-05/06/18.

## 2018-02-07 ENCOUNTER — Other Ambulatory Visit: Payer: Self-pay | Admitting: Family Medicine

## 2018-02-07 DIAGNOSIS — K589 Irritable bowel syndrome without diarrhea: Secondary | ICD-10-CM

## 2018-02-07 DIAGNOSIS — F419 Anxiety disorder, unspecified: Secondary | ICD-10-CM

## 2018-02-11 NOTE — Telephone Encounter (Signed)
[facility] Vilma Praderhomas Janse Schermerhorn, MD.

## 2018-04-04 ENCOUNTER — Other Ambulatory Visit: Payer: Self-pay | Admitting: Family Medicine

## 2018-04-04 DIAGNOSIS — F419 Anxiety disorder, unspecified: Secondary | ICD-10-CM | POA: Diagnosis not present

## 2018-04-04 DIAGNOSIS — Z124 Encounter for screening for malignant neoplasm of cervix: Secondary | ICD-10-CM | POA: Diagnosis not present

## 2018-04-04 DIAGNOSIS — Z1331 Encounter for screening for depression: Secondary | ICD-10-CM | POA: Diagnosis not present

## 2018-04-04 DIAGNOSIS — K589 Irritable bowel syndrome without diarrhea: Secondary | ICD-10-CM

## 2018-04-04 DIAGNOSIS — Z01419 Encounter for gynecological examination (general) (routine) without abnormal findings: Secondary | ICD-10-CM | POA: Diagnosis not present

## 2018-04-04 DIAGNOSIS — F4321 Adjustment disorder with depressed mood: Secondary | ICD-10-CM | POA: Diagnosis not present

## 2018-04-04 DIAGNOSIS — R87612 Low grade squamous intraepithelial lesion on cytologic smear of cervix (LGSIL): Secondary | ICD-10-CM | POA: Diagnosis not present

## 2018-09-13 ENCOUNTER — Other Ambulatory Visit: Payer: Self-pay | Admitting: Family Medicine

## 2018-09-13 DIAGNOSIS — K589 Irritable bowel syndrome without diarrhea: Secondary | ICD-10-CM

## 2018-09-13 DIAGNOSIS — F419 Anxiety disorder, unspecified: Secondary | ICD-10-CM

## 2018-10-17 ENCOUNTER — Ambulatory Visit: Payer: BLUE CROSS/BLUE SHIELD | Admitting: Family Medicine

## 2019-01-02 ENCOUNTER — Telehealth (HOSPITAL_COMMUNITY): Payer: Self-pay | Admitting: Psychology

## 2019-01-02 ENCOUNTER — Ambulatory Visit (HOSPITAL_COMMUNITY): Payer: Self-pay | Admitting: Psychology

## 2019-07-13 ENCOUNTER — Ambulatory Visit: Payer: Self-pay | Attending: Internal Medicine

## 2019-09-01 ENCOUNTER — Ambulatory Visit: Payer: Self-pay

## 2019-10-09 ENCOUNTER — Ambulatory Visit: Payer: Self-pay

## 2019-10-09 ENCOUNTER — Other Ambulatory Visit: Payer: Self-pay

## 2019-10-09 ENCOUNTER — Ambulatory Visit (LOCAL_COMMUNITY_HEALTH_CENTER): Payer: Self-pay | Admitting: Family Medicine

## 2019-10-09 VITALS — BP 117/73 | Ht 67.0 in | Wt 147.4 lb

## 2019-10-09 DIAGNOSIS — Z3009 Encounter for other general counseling and advice on contraception: Secondary | ICD-10-CM

## 2019-10-09 LAB — PREGNANCY, URINE: Preg Test, Ur: NEGATIVE

## 2019-10-09 MED ORDER — LEVONORGESTREL 1.5 MG PO TABS: 1.5000 mg | ORAL_TABLET | Freq: Once | ORAL | 0 refills | Status: AC

## 2019-10-09 MED ORDER — MEDROXYPROGESTERONE ACETATE 150 MG/ML IM SUSP
150.0000 mg | INTRAMUSCULAR | Status: AC
  Administered 2019-10-09: 150 mg via INTRAMUSCULAR

## 2019-10-09 NOTE — Progress Notes (Addendum)
Here today as a new patient and to start Depo. Routinely receives care at [facility]. Last Pap Smear there was 04/13/2018 (LSIL.) Declines STD screening including bloodwork. Tawny Hopping, RN

## 2019-10-09 NOTE — Progress Notes (Signed)
Depo given and tolerated well. Chancy Claros, RN  

## 2019-10-09 NOTE — Progress Notes (Signed)
Family Planning Visit  Subjective:  Michele Hunt is a 24 y.o. being seen today for  Chief Complaint  Patient presents with  . Contraception    Pt has Anxiety; Panic attack; Allergic rhinitis; and Irritable bowel syndrome on their problem list.  HPI  Patient reports she is here to restart Depo. Gets routine care at [facility]. She recently started acyclovir for possible HSV outbreak, would like exam to see if healing today.  Pt denies all of the following, which are contraindications to Depo use: Known breast cancer Pregnancy Also denies: Hypertension (CDC cat 2 if mild, cat 3 if severe) Severe cirrhosis, hepatocellular adenoma Diabetes with nephrosis or vascular complications Ischemic heart disease or multiple risk factors for atherosclerotic disease, and some forms of lupus Unexplained vaginal bleeding Pregnancy planned within the next year Long-term use of corticosteroid therapy in women with a history of, or risk factors for, nontraumatic (frailty) fractures.  Current use of aminoglutethimide (usually for the treatment of Cushing's syndrome) because aminoglutethimide may increase metabolism of progestins    Patient's last menstrual period was 09/04/2019 (approximate). Last sex: 4/18 BCM: no condom Pt desires EC? yes  Last pap: 04/04/2018: LSIL. She did not get recommended 1 yr repeat pap d/t Covid. She would like this today.  Last breast exam: 1 mo ago per pt  Patient reports 2 partner(s) in last year. Do they desire STI screening (if no, why not)? No, done recently  Does the patient desire a pregnancy in the next year? No    24 y.o., Body mass index is 23.09 kg/m. - Is patient eligible for HA1C diabetes screening based on BMI and age >26?  no  Does the patient have a current or past history of drug use? no No components found for: HCV  See flowsheet for other program required questions.   Health Maintenance Due  Topic Date Due  . HIV Screening  Never done  . COVID-19  Vaccine (1) Never done  . PAP-Cervical Cytology Screening  Never done  . PAP SMEAR-Modifier  Never done  . TETANUS/TDAP  02/13/2016    ROS  The following portions of the patient's history were reviewed and updated as appropriate: allergies, current medications, past family history, past medical history, past social history, past surgical history and problem list. Problem list updated.  Objective:  BP 117/73   Ht 5\' 7"  (1.702 m)   Wt 147 lb 6.4 oz (66.9 kg)   LMP 09/04/2019 (Approximate)   BMI 23.09 kg/m    Physical Exam  Gen: well appearing, NAD HEENT: no scleral icterus Lung: Normal WOB Ext: well perfused, no edema  PELVIC: External genitalia with mild erythema at base of introitus; normal appearing vaginal mucosa and cervix.  No abnormal discharge noted.  Pap smear obtained.  Normal uterine size, no other palpable masses, no uterine or adnexal tenderness.   Assessment and Plan:  Michele Hunt is a 24 y.o. female presenting to the [facility] Department for a well woman exam/family planning visit  Contraception counseling: Reviewed all forms of birth control options in the tiered based approach including abstinence; over the counter/barrier methods; hormonal contraceptive medication including pill, patch, ring, injection, contraceptive implant; hormonal and nonhormonal IUDs; permanent sterilization options including vasectomy and the various tubal sterilization modalities. Risks, benefits, how to discontinue and typical effectiveness rates were reviewed.  Questions were answered.  Written information was also given to the patient to review.  Patient desires depo, this was prescribed for patient. She will follow  up in  3 months for surveillance.  She was told to call with any further questions, or with any concerns about this method of contraception.  Emphasized use of condoms 100% of the time for STI prevention.  Emergency Contraception: Pt was offered ECP. ECP was  accepted by pt. ECP counseling was given - see RN documentation.    1. Family planning services -Depo rx x1 yr. We discussed that since pt has had unprotected sex 1 day ago, LMP >1 mo ago she is at risk of pregnancy even though urine preg test is negative today. Discussed risks and benefits of following options: Depo today + home preg test in 13 d or wait 13 d, RTC for preg test + Depo. She verbalizes understanding of risks and prefers to get Depo today, take home preg test in 13 d.  -Pt to use backup contraception for 2 weeks. Recommended condoms 100% of time for STI protection. -Pap today.  -Pt is taking acyclovir for possible HSV outbreak. No signs of HSV on exam aside from mild erythema and tenderness at base of introitus. Advised aloe vera gel in addition to acyclovir if area remains tender after completion. - Pregnancy, urine - IGP, rfx Aptima HPV ASCU - levonorgestrel (PLAN B ONE-STEP) 1.5 MG tablet; Take 1 tablet (1.5 mg total) by mouth once for 1 dose.  Dispense: 1 tablet; Refill: 0 - medroxyPROGESTERone (DEPO-PROVERA) injection 150 mg     Return in about 3 months (around 01/08/2020) for Depo.  No future appointments.  Ann Held, PA-C

## 2019-10-10 ENCOUNTER — Encounter: Payer: Self-pay | Admitting: Family Medicine

## 2019-10-12 LAB — IGP, RFX APTIMA HPV ASCU: PAP Smear Comment: 0

## 2019-10-13 ENCOUNTER — Telehealth: Payer: Self-pay

## 2019-10-18 NOTE — Telephone Encounter (Signed)
Attempted TC to patient for third time.  LM to call [facility].  Letter mailed [name] Campbell, RN

## 2019-10-20 NOTE — Telephone Encounter (Signed)
TC from patient. Informed abn pap and need for colpo. Discussed [facility]CP and patient agrees to referral. [name] Campbell, RN

## 2019-10-31 ENCOUNTER — Ambulatory Visit: Payer: Self-pay | Attending: Oncology

## 2019-10-31 NOTE — Progress Notes (Signed)
Patient pre-screened for BCCCP eligibility. Two patient identifiers used for verification that I was speaking to correct patient.  Patient to Present directly to [facility] 5/13/21for colposcopy.

## 2019-11-02 ENCOUNTER — Other Ambulatory Visit: Payer: Self-pay

## 2019-11-02 ENCOUNTER — Encounter: Payer: Self-pay | Admitting: Obstetrics and Gynecology

## 2019-11-02 ENCOUNTER — Ambulatory Visit (INDEPENDENT_AMBULATORY_CARE_PROVIDER_SITE_OTHER): Payer: Self-pay | Admitting: Obstetrics and Gynecology

## 2019-11-02 VITALS — BP 128/81 | HR 85 | Ht 67.0 in | Wt 142.5 lb

## 2019-11-02 DIAGNOSIS — R87612 Low grade squamous intraepithelial lesion on cytologic smear of cervix (LGSIL): Secondary | ICD-10-CM

## 2019-11-02 NOTE — Progress Notes (Signed)
Referring Provider:  [facility]  HPI:  Michele Hunt is a 24 y.o.  G0P0000  who presents today for evaluation and management of abnormal cervical cytology.    Dysplasia History:  LGSIL -this is her second abnormal Pap in a row.  Patient does smoke cigarettes.  ROS:  Pertinent items noted in HPI and remainder of comprehensive ROS otherwise negative.  OB History  Gravida Para Term Preterm AB Living  0 0 0 0 0 0  SAB TAB Ectopic Multiple Live Births  0 0 0 0      Past Medical History:  Diagnosis Date  . History of chicken pox 1997    Past Surgical History:  Procedure Laterality Date  . Cervical Lymph Node Removed     Neck  . Stye Removed    . WISDOM TOOTH EXTRACTION  2013    SOCIAL HISTORY: Social History   Substance and Sexual Activity  Alcohol Use Yes  . Alcohol/week: 4.0 standard drinks  . Types: 4 Shots of liquor per week   Social History   Substance and Sexual Activity  Drug Use No     Family History  Problem Relation Age of Onset  . Heart Problems Mother   . Hodgkin's lymphoma Mother   . Hypertension Father   . Breast cancer Neg Hx     ALLERGIES:  Ciprofloxacin  She has a current medication list which includes the following Facility-Administered Medications: medroxyprogesterone.  Physical Exam: -Vitals:  BP 128/81   Pulse 85   Ht 5\' 7"  (1.702 m)   Wt 142 lb 8 oz (64.6 kg)   LMP 11/01/2019   BMI 22.32 kg/m   PROCEDURE: Colposcopy performed with 4% acetic acid after verbal consent obtained                           -Aceto-white Lesions Location(s): 3-6 o'clock.              -Biopsy performed at 4 o'clock               -ECC indicated and performed: No.     -Biopsy sites made hemostatic with pressure and Monsel's solution   -Satisfactory colposcopy: Yes.      -Evidence of Invasive cervical CA :  NO  ASSESSMENT:  Michele Hunt is a 24 y.o. G0P0000 here for  1. Low grade squamous intraepithelial lesion on cytologic smear of cervix (LGSIL)   2.  High risk human papilloma virus (HPV) infection of cervix   .  PLAN: 1.  I discussed the grading system of pap smears and HPV high risk viral types.  We will discuss management after colpo results return.  No orders of the defined types were placed in this encounter.          F/U  Return in about 2 weeks (around 11/16/2019) for Colpo f/u.  11/18/2019 ,MD 11/02/2019,11:49 AM

## 2019-11-03 ENCOUNTER — Other Ambulatory Visit: Payer: Self-pay | Admitting: Obstetrics and Gynecology

## 2019-11-09 LAB — ANATOMIC PATHOLOGY REPORT

## 2019-11-15 NOTE — Progress Notes (Signed)
Yes.  The provider that performs the biopsy typically notifies the patient of results.  I appreciate your sending the message !  As the [facility] provider I have called patients, especially with [facility] results available to patient.  I will call her today.  Thank you so much.

## 2019-11-15 NOTE — Progress Notes (Signed)
Sounds good and I have alerted our Pap RN to assure we follow up as well.

## 2019-11-15 NOTE — Progress Notes (Signed)
Is Joyann aware of results?  If so I can go ahead and get Medicaid application sent.  I was waiting for Dr. Logan Bores f/u next Tuesday.

## 2019-11-21 ENCOUNTER — Ambulatory Visit: Payer: Self-pay | Admitting: Obstetrics and Gynecology

## 2019-11-22 ENCOUNTER — Ambulatory Visit: Payer: Self-pay | Admitting: Obstetrics and Gynecology

## 2019-11-23 ENCOUNTER — Encounter: Payer: Self-pay | Admitting: Obstetrics and Gynecology

## 2019-11-23 ENCOUNTER — Ambulatory Visit (INDEPENDENT_AMBULATORY_CARE_PROVIDER_SITE_OTHER): Payer: Self-pay | Admitting: Obstetrics and Gynecology

## 2019-11-23 VITALS — BP 116/73 | HR 68 | Ht 67.0 in | Wt 144.5 lb

## 2019-11-23 DIAGNOSIS — Z720 Tobacco use: Secondary | ICD-10-CM

## 2019-11-23 DIAGNOSIS — N871 Moderate cervical dysplasia: Secondary | ICD-10-CM

## 2019-11-23 NOTE — Progress Notes (Signed)
HPI:      [name] is a 24 y.o. G0P0000 who LMP was Patient's last menstrual period was 11/01/2019.  Subjective:   She presents today to discuss her colposcopy findings. Patient does smoke cigarettes. She is actively attempting pregnancy at this time.    Hx: The following portions of the patient's history were reviewed and updated as appropriate:             She  has a past medical history of History of chicken pox (1997). She does not have any pertinent problems on file. She  has a past surgical history that includes Wisdom tooth extraction (2013); Cervical Lymph Node Removed; and Stye Removed. Her family history includes Heart Problems in her mother; Hodgkin's lymphoma in her mother; Hypertension in her father. She  reports that she has been smoking cigarettes. She has been smoking about 0.75 packs per day. She has never used smokeless tobacco. She reports current alcohol use of about 4.0 standard drinks of alcohol per week. She reports that she does not use drugs. She has a current medication list which includes the following Facility-Administered Medications: medroxyprogesterone. She is allergic to ciprofloxacin.       Review of Systems:  Review of Systems  Constitutional: Denied constitutional symptoms, night sweats, recent illness, fatigue, fever, insomnia and weight loss.  Eyes: Denied eye symptoms, eye pain, photophobia, vision change and visual disturbance.  Ears/Nose/Throat/Neck: Denied ear, nose, throat or neck symptoms, hearing loss, nasal discharge, sinus congestion and sore throat.  Cardiovascular: Denied cardiovascular symptoms, arrhythmia, chest pain/pressure, edema, exercise intolerance, orthopnea and palpitations.  Respiratory: Denied pulmonary symptoms, asthma, pleuritic pain, productive sputum, cough, dyspnea and wheezing.  Gastrointestinal: Denied, gastro-esophageal reflux, melena, nausea and vomiting.  Genitourinary: Denied genitourinary symptoms including  symptomatic vaginal discharge, pelvic relaxation issues, and urinary complaints.  Musculoskeletal: Denied musculoskeletal symptoms, stiffness, swelling, muscle weakness and myalgia.  Dermatologic: Denied dermatology symptoms, rash and scar.  Neurologic: Denied neurology symptoms, dizziness, headache, neck pain and syncope.  Psychiatric: Denied psychiatric symptoms, anxiety and depression.  Endocrine: Denied endocrine symptoms including hot flashes and night sweats.   Meds:   No current outpatient medications on file prior to visit.   Current Facility-Administered Medications on File Prior to Visit  Medication Dose Route Frequency Provider Last Rate Last Admin  . medroxyPROGESTERone (DEPO-PROVERA) injection 150 mg  150 mg Intramuscular Q90 days Staples, Jenna L, PA-C   150 mg at 10/09/19 1530    Objective:     Vitals:   11/23/19 1119  BP: 116/73  Pulse: 68              CIN-2 by colposcopically directed biopsies.  Assessment:    G0P0000 Patient Active Problem List   Diagnosis Date Noted  . Irritable bowel syndrome 09/03/2017  . Allergic rhinitis 05/01/2015  . Anxiety 04/17/2015  . Panic attack 04/17/2015     1. CIN II (cervical intraepithelial neoplasia II)   2. Tobacco use        Plan:            1.  We have discussed the natural course and history of HPV as well as the effect HPV has on the cervix dysplasia and precancerous lesions.  All of her questions were answered. We specifically discussed [facility] recommending LEEP for CIN-2.  We also discussed the option of close colposcopically directed biopsy follow-up with the patient's discontinuation of tobacco use.  All of this was also discussed in light of the patient's desire  to become pregnant at this time. She has chosen close colposcopy follow-up.  Risks and benefits were discussed with her.  Follow-up colpo in 6 months. Orders No orders of the defined types were placed in this encounter.   No orders of the  defined types were placed in this encounter.     F/U  Return in about 6 months (around 05/24/2020). I spent 23 minutes involved in the care of this patient preparing to see the patient by obtaining and reviewing her medical history (including labs, imaging tests and prior procedures), documenting clinical information in the electronic health record (EHR), counseling and coordinating care plans, writing and sending prescriptions, ordering tests or procedures and directly communicating with the patient by discussing pertinent items from her history and physical exam as well as detailing my assessment and plan as noted above so that she has an informed understanding.  All of her questions were answered.  Elonda Husky, M.D. 11/23/2019 12:00 PM

## 2019-11-29 NOTE — Progress Notes (Signed)
Per Dr. Logan Bores, patient is to return in 6 months for a repeat colposcopy.  LEEP vs. Repeat colpo discussed with patient. Patient is scheduled to return to Dr, Logan Bores office 05/30/20.  Copy to [facility].

## 2019-12-03 ENCOUNTER — Encounter: Payer: Self-pay | Admitting: Family Medicine

## 2019-12-03 DIAGNOSIS — N871 Moderate cervical dysplasia: Secondary | ICD-10-CM | POA: Insufficient documentation

## 2020-05-22 NOTE — Progress Notes (Unsigned)
Per Epic, patient already scheduled for 6 month Colpo with Dr. Logan Bores on 05/30/2020. Hart Carwin, RN

## 2020-05-30 ENCOUNTER — Other Ambulatory Visit: Payer: Self-pay

## 2020-05-30 ENCOUNTER — Encounter: Payer: Self-pay | Admitting: Obstetrics and Gynecology

## 2020-05-30 ENCOUNTER — Other Ambulatory Visit (HOSPITAL_COMMUNITY)
Admission: RE | Admit: 2020-05-30 | Discharge: 2020-05-30 | Disposition: A | Discharge status: Home / Self Care | Payer: Self-pay | Source: Ambulatory Visit | Attending: Obstetrics and Gynecology | Admitting: Obstetrics and Gynecology

## 2020-05-30 ENCOUNTER — Ambulatory Visit (INDEPENDENT_AMBULATORY_CARE_PROVIDER_SITE_OTHER): Payer: Self-pay | Admitting: Obstetrics and Gynecology

## 2020-05-30 VITALS — BP 111/76 | HR 108 | Ht 67.0 in | Wt 153.4 lb

## 2020-05-30 DIAGNOSIS — Z720 Tobacco use: Secondary | ICD-10-CM

## 2020-05-30 DIAGNOSIS — N871 Moderate cervical dysplasia: Secondary | ICD-10-CM | POA: Insufficient documentation

## 2020-05-30 NOTE — Progress Notes (Signed)
   HPI:  LORIJEAN HUSSER is a 24 y.o.  G0P0000  who presents today for evaluation and management of abnormal cervical cytology.    Dysplasia History: CIN-2      6 months ago at colposcopy.  She is no longer specifically attempting pregnancy.  But "if it happens it happens."  Not using birth control.  She does complain of irregular menstrual cycles and some of them are very heavy.   ROS:  Pertinent items noted in HPI and remainder of comprehensive ROS otherwise negative.  OB History  Gravida Para Term Preterm AB Living  0 0 0 0 0 0  SAB IAB Ectopic Multiple Live Births  0 0 0 0      Past Medical History:  Diagnosis Date  . History of chicken pox 1997    Past Surgical History:  Procedure Laterality Date  . Cervical Lymph Node Removed     Neck  . Stye Removed    . WISDOM TOOTH EXTRACTION  2013    SOCIAL HISTORY:  Social History   Substance and Sexual Activity  Alcohol Use Yes  . Alcohol/week: 4.0 standard drinks  . Types: 4 Shots of liquor per week    Social History   Substance and Sexual Activity  Drug Use No     Family History  Problem Relation Age of Onset  . Heart Problems Mother   . Hodgkin's lymphoma Mother   . Hypertension Father   . Breast cancer Neg Hx     ALLERGIES:  Ciprofloxacin  She has a current medication list which includes the following Facility-Administered Medications: medroxyprogesterone.  Physical Exam: -Vitals:  BP 111/76   Pulse (!) 108   Ht 5\' 7"  (1.702 m)   Wt 153 lb 6.4 oz (69.6 kg)   BMI 24.03 kg/m   PROCEDURE: Colposcopy performed with 4% acetic acid after verbal consent obtained                           -Aceto-white Lesions Location(s): As above              -Biopsy performed at 1 and 4 o'clock               -ECC indicated and performed: No.     -Biopsy sites made hemostatic with pressure and Monsel's solution   -Satisfactory colposcopy: Yes.      -Evidence of Invasive cervical CA :  NO  ASSESSMENT:  JALAH WARMUTH  is a 24 y.o. G0P0000 here for  1. CIN II (cervical intraepithelial neoplasia II)   2. Tobacco use   .  PLAN: 1.  I discussed the grading system of pap smears and HPV high risk viral types.  We will discuss management after colpo results return. 2.  Patient advised to schedule appointment for irregular menses/heavy menses/possible infertility whenever she is ready.  No orders of the defined types were placed in this encounter.          F/U  Return for We will contact her with any abnormal test results.  30 ,MD 05/30/2020,11:37 AM

## 2020-05-30 NOTE — Addendum Note (Signed)
Addended by: Dorian Pod on: 05/30/2020 01:54 PM   Modules accepted: Orders

## 2020-06-03 LAB — SURGICAL PATHOLOGY

## 2020-06-07 ENCOUNTER — Encounter: Payer: Self-pay | Admitting: Surgical

## 2020-06-07 ENCOUNTER — Telehealth (HOSPITAL_COMMUNITY): Payer: Self-pay | Admitting: Surgical

## 2020-06-07 NOTE — Telephone Encounter (Signed)
Tried to call patient several times to have her come in to discuss colpo results with Dr. Logan Bores. Mail box full unable to leave voicemail. Will send my chart message.

## 2020-06-18 NOTE — Progress Notes (Signed)
Multiple unsuccessful attempts to reach patient by phone.  Email sent requesting patient call back to schedule BCCCP Medicaid review and application completion for recommended LEEP procedure with Dr. Logan Bores.

## 2020-06-24 ENCOUNTER — Ambulatory Visit (INDEPENDENT_AMBULATORY_CARE_PROVIDER_SITE_OTHER): Payer: Self-pay | Admitting: Obstetrics and Gynecology

## 2020-06-24 ENCOUNTER — Other Ambulatory Visit: Payer: Self-pay

## 2020-06-24 ENCOUNTER — Encounter: Payer: Self-pay | Admitting: Obstetrics and Gynecology

## 2020-06-24 VITALS — BP 129/81 | HR 108 | Ht 67.0 in | Wt 152.6 lb

## 2020-06-24 DIAGNOSIS — Z3009 Encounter for other general counseling and advice on contraception: Secondary | ICD-10-CM

## 2020-06-24 DIAGNOSIS — N914 Secondary oligomenorrhea: Secondary | ICD-10-CM

## 2020-06-24 DIAGNOSIS — Z30011 Encounter for initial prescription of contraceptive pills: Secondary | ICD-10-CM

## 2020-06-24 DIAGNOSIS — D069 Carcinoma in situ of cervix, unspecified: Secondary | ICD-10-CM

## 2020-06-24 LAB — POCT URINE PREGNANCY: Preg Test, Ur: NEGATIVE

## 2020-06-24 MED ORDER — LO LOESTRIN FE 1 MG-10 MCG / 10 MCG PO TABS: 1.0000 | ORAL_TABLET | Freq: Every day | ORAL | 0 refills | Status: DC

## 2020-06-24 NOTE — Addendum Note (Signed)
Addended by: Dorian Pod on: 06/24/2020 10:59 AM   Modules accepted: Orders

## 2020-06-24 NOTE — Progress Notes (Signed)
HPI:      [name] is a 25 y.o. G0P0000 who LMP was No LMP recorded (lmp unknown).  Subjective:   She presents today to discuss her abnormal colposcopy showing CIN-3.  Her previous colposcopy 6 months ago revealed CIN-2. In addition patient is not using anything for birth control-having unprotected intercourse.  She does not desire pregnancy at this time.  She would like to consider birth control options. She does state that she has had an intermittent midline pelvic cramping, has missed a menstrual period and has breast tenderness.  She states that she does not believe she is pregnant. Patient continues to smoke cigarettes.    Hx: The following portions of the patient's history were reviewed and updated as appropriate:             She  has a past medical history of History of chicken pox (1997). She does not have any pertinent problems on file. She  has a past surgical history that includes Wisdom tooth extraction (2013); Cervical Lymph Node Removed; and Stye Removed. Her family history includes Heart Problems in her mother; Hodgkin's lymphoma in her mother; Hypertension in her father. She  reports that she has been smoking cigarettes. She has been smoking about 0.75 packs per day. She has never used smokeless tobacco. She reports current alcohol use of about 4.0 standard drinks of alcohol per week. She reports that she does not use drugs. She has a current medication list which includes the following prescription(s): lo loestrin fe, and the following Facility-Administered Medications: medroxyprogesterone. She is allergic to ciprofloxacin.       Review of Systems:  Review of Systems  Constitutional: Denied constitutional symptoms, night sweats, recent illness, fatigue, fever, insomnia and weight loss.  Eyes: Denied eye symptoms, eye pain, photophobia, vision change and visual disturbance.  Ears/Nose/Throat/Neck: Denied ear, nose, throat or neck symptoms, hearing loss, nasal  discharge, sinus congestion and sore throat.  Cardiovascular: Denied cardiovascular symptoms, arrhythmia, chest pain/pressure, edema, exercise intolerance, orthopnea and palpitations.  Respiratory: Denied pulmonary symptoms, asthma, pleuritic pain, productive sputum, cough, dyspnea and wheezing.  Gastrointestinal: Denied, gastro-esophageal reflux, melena, nausea and vomiting.  Genitourinary: Denied genitourinary symptoms including symptomatic vaginal discharge, pelvic relaxation issues, and urinary complaints.  Musculoskeletal: Denied musculoskeletal symptoms, stiffness, swelling, muscle weakness and myalgia.  Dermatologic: Denied dermatology symptoms, rash and scar.  Neurologic: Denied neurology symptoms, dizziness, headache, neck pain and syncope.  Psychiatric: Denied psychiatric symptoms, anxiety and depression.  Endocrine: Denied endocrine symptoms including hot flashes and night sweats.   Meds:   No current outpatient medications on file prior to visit.   Current Facility-Administered Medications on File Prior to Visit  Medication Dose Route Frequency Provider Last Rate Last Admin  . medroxyPROGESTERone (DEPO-PROVERA) injection 150 mg  150 mg Intramuscular Q90 days Staples, Jenna L, PA-C   150 mg at 10/09/19 1530       The pregnancy intention screening data noted above was reviewed. Potential methods of contraception were discussed. The patient elected to proceed with Oral Contraceptive.     Objective:     Vitals:   06/24/20 1025  BP: 129/81  Pulse: (!) 108   Filed Weights   06/24/20 1025  Weight: 152 lb 9.6 oz (69.2 kg)              Urinary pregnancy test negative.   CIN-3 by colposcopically directed biopsies.  Assessment:    G0P0000 Patient Active Problem List   Diagnosis Date Noted  . Dysplasia  of cervix, high grade CIN 2 12/03/2019  . Irritable bowel syndrome 09/03/2017  . Allergic rhinitis 05/01/2015  . Anxiety 04/17/2015  . Panic attack 04/17/2015      1. High grade squamous intraepithelial lesion (HGSIL), grade 3 CIN, on biopsy of cervix   2. Secondary oligomenorrhea   3. Birth control counseling   4. Initiation of OCP (BCP)     Patient has CIN-3 by colposcopically directed biopsies.  Area of cervical involvement small enough that LEEP is appropriate.   Plan:            1.  Discussed the rationale of LEEP for CIN-3.  Procedure of LEEP discussed.  All questions answered.  Natural course and history of dysplasia and HPV discussed in detail.  Follow-up after LEEP discussed.  Patient to call after start of next menses to schedule LEEP.  2.  Advised patient to have a pregnancy test performed because of her symptoms as noted above.  3.  Multiple birth control methods discussed.  Patient desires OCPs.  She has taken OCPs before.  She is hoping able to provide birth control as well as regulate her cycle.  OCPs The risks /benefits of OCPs have been explained to the patient in detail.  Product literature has been given to her where appropriate.  I have instructed her in the use of OCPs.  I have explained to the patient that OCPs are not as effective for birth control during the first month of use, and that another form of contraception should be used during this time.  Both first-day start and Sunday start have been explained.  The risks and benefits of each was discussed.  She has been made aware of  the fact that in rare circumstances, other medications may affect the efficacy of OCPs.  I have answered all of her questions, and I believe that she has an understanding of the effectiveness and use of OCPs. We will start LoLoEstrin at the start of her next menses. Orders No orders of the defined types were placed in this encounter.    Meds ordered this encounter  Medications  . Norethindrone-Ethinyl Estradiol-Fe Biphas (LO LOESTRIN FE) 1 MG-10 MCG / 10 MCG tablet    Sig: Take 1 tablet by mouth at bedtime for 28 days.    Dispense:  84 tablet     Refill:  0      F/U  Return for She is to call at the start of next menses. I spent 24 minutes involved in the care of this patient preparing to see the patient by obtaining and reviewing her medical history (including labs, imaging tests and prior procedures), documenting clinical information in the electronic health record (EHR), counseling and coordinating care plans, writing and sending prescriptions, ordering tests or procedures and directly communicating with the patient by discussing pertinent items from her history and physical exam as well as detailing my assessment and plan as noted above so that she has an informed understanding.  All of her questions were answered.  Elonda Husky, M.D. 06/24/2020 10:54 AM

## 2020-07-12 ENCOUNTER — Other Ambulatory Visit: Payer: Self-pay | Admitting: Surgical

## 2020-08-27 ENCOUNTER — Telehealth: Payer: Self-pay

## 2020-08-27 NOTE — Telephone Encounter (Signed)
Telephone call to patient regarding her pending LEEP procedure and where she is in the process.  Voicemail full.

## 2020-10-24 ENCOUNTER — Telehealth: Payer: Self-pay | Admitting: Nurse Practitioner

## 2020-10-24 NOTE — Telephone Encounter (Signed)
Telephone call to patient regarding LEEP procedure.  Patient states that she has a scheduled appointment with [facility] for a LEEP procedure on 11/05/20.  Will follow up for kept appointment and office notes.  Glenna Fellows, RN

## 2020-11-05 ENCOUNTER — Encounter: Payer: Self-pay | Admitting: Obstetrics and Gynecology

## 2020-11-05 ENCOUNTER — Ambulatory Visit (INDEPENDENT_AMBULATORY_CARE_PROVIDER_SITE_OTHER): Payer: Self-pay | Admitting: Obstetrics and Gynecology

## 2020-11-05 ENCOUNTER — Other Ambulatory Visit: Payer: Self-pay | Admitting: Obstetrics and Gynecology

## 2020-11-05 ENCOUNTER — Other Ambulatory Visit: Payer: Self-pay

## 2020-11-05 VITALS — BP 124/84 | HR 74 | Ht 67.0 in | Wt 142.4 lb

## 2020-11-05 DIAGNOSIS — D061 Carcinoma in situ of exocervix: Secondary | ICD-10-CM

## 2020-11-05 NOTE — Progress Notes (Signed)
HPI:      [name] is a 25 y.o. G0P0000 who LMP was Patient's last menstrual period was 10/22/2020 (approximate).  Subjective:   She presents today for LEEP for CIN-3. This has been a progression from CIN-2. She was first diagnosed with this in December 2021 but she has been limited in her compliance with scheduled visits. She does smoke cigarettes. She is not taking OCPs. She says she is sexually active and not using any birth control.  She says she is about to have her menses and has begun spotting.   Hx: The following portions of the patient's history were reviewed and updated as appropriate:             She  has a past medical history of History of chicken pox (1997). She does not have any pertinent problems on file. She  has a past surgical history that includes Wisdom tooth extraction (2013); Cervical Lymph Node Removed; and Stye Removed. Her family history includes Heart Problems in her mother; Hodgkin's lymphoma in her mother; Hypertension in her father. She  reports that she has been smoking cigarettes. She has been smoking about 0.75 packs per day. She has never used smokeless tobacco. She reports current alcohol use of about 4.0 standard drinks of alcohol per week. She reports that she does not use drugs. She currently has no medications in their medication list. She is allergic to ciprofloxacin.       Review of Systems:  Review of Systems  Constitutional: Denied constitutional symptoms, night sweats, recent illness, fatigue, fever, insomnia and weight loss.  Eyes: Denied eye symptoms, eye pain, photophobia, vision change and visual disturbance.  Ears/Nose/Throat/Neck: Denied ear, nose, throat or neck symptoms, hearing loss, nasal discharge, sinus congestion and sore throat.  Cardiovascular: Denied cardiovascular symptoms, arrhythmia, chest pain/pressure, edema, exercise intolerance, orthopnea and palpitations.  Respiratory: Denied pulmonary symptoms, asthma, pleuritic  pain, productive sputum, cough, dyspnea and wheezing.  Gastrointestinal: Denied, gastro-esophageal reflux, melena, nausea and vomiting.  Genitourinary: Denied genitourinary symptoms including symptomatic vaginal discharge, pelvic relaxation issues, and urinary complaints.  Musculoskeletal: Denied musculoskeletal symptoms, stiffness, swelling, muscle weakness and myalgia.  Dermatologic: Denied dermatology symptoms, rash and scar.  Neurologic: Denied neurology symptoms, dizziness, headache, neck pain and syncope.  Psychiatric: Denied psychiatric symptoms, anxiety and depression.  Endocrine: Denied endocrine symptoms including hot flashes and night sweats.   Meds:   No current outpatient medications on file prior to visit.   No current facility-administered medications on file prior to visit.          Objective:     Vitals:   11/05/20 1112  BP: 124/84  Pulse: 74   Filed Weights   11/05/20 1112  Weight: 142 lb 6.4 oz (64.6 kg)              LEEP The LEEP has been explained to the patient in detail; risks/benefits reviewed.  The risks include, but are not limited to, bleeding, infection, and the possibility of cervical stenosis or cervical incompetence.  The patient had previously been given information regarding abnormal PAP smears and their relationship to HPV.  We have discussed the natural course and history of HPV, the possibility of incomplete treatment by the LEEP, as well as the possibility of recurrence.  I have reviewed the consent form for LEEP with her, and she fully understands its contents.  We have discussed the procedure itself. I have informed her that following the LEEP she should refrain from intercourse and  the use of tampons for three weeks, and that she should also expect some spotting and brown/black discharge over the next several days.  We have discussed the fact that vaginal bleeding, differentiated from spotting, is not normal and that if she should have this  complication, she should contact me immediately.  The follow-up after LEEP will be PAP smears or viral typing performed at regular intervals for up to 3 years.  Should these all prove to be normal, she will then be back on typical cervical screening.  I have answered all of her questions, and I believe she has an adequate understanding of the LEEP, its implications, and the necessity of follow-up care.  LEEP PROCEDURE NOTE I again discussed her colpo results and explained the procedure of LEEP.  All questions were answered and she signed the consent form.  LEEP performed in the usual manner after reviewing the previous colpo findings and results. Lugol's solution was used to identify any abnormal areas of the cervix.  These were compared with the previous colpo pictures. The cervix was cleansed with betadine solution. Local injection of Lidocaine was performed for anesthesia. 40 Watts Blend current was used to remove the specimen.  It was labeled accordingly. The base and edges of the defect was then cauterized using coagulation current. Monsel's solution was then applied in the usual manner.     Assessment:    G0P0000 Patient Active Problem List   Diagnosis Date Noted  . Dysplasia of cervix, high grade CIN 2 12/03/2019  . Irritable bowel syndrome 09/03/2017  . Allergic rhinitis 05/01/2015  . Anxiety 04/17/2015  . Panic attack 04/17/2015     1. Carcinoma in situ of exocervix        Plan:            1.  Follow-up in 4 weeks -with recommended Pap in 6 months. Orders No orders of the defined types were placed in this encounter.   No orders of the defined types were placed in this encounter.     F/U  Return in about 4 weeks (around 12/03/2020).  Elonda Husky, M.D. 11/05/2020 11:42 AM

## 2020-11-13 LAB — ANATOMIC PATHOLOGY REPORT

## 2020-11-19 ENCOUNTER — Ambulatory Visit: Payer: Self-pay | Attending: Oncology

## 2020-12-04 ENCOUNTER — Encounter: Payer: Self-pay | Admitting: Obstetrics and Gynecology

## 2020-12-24 ENCOUNTER — Encounter: Payer: Self-pay | Admitting: Obstetrics and Gynecology

## 2021-05-17 ENCOUNTER — Emergency Department
Admission: EM | Admit: 2021-05-17 | Discharge: 2021-05-18 | Disposition: A | Discharge status: Home / Self Care | Payer: Self-pay | Attending: Emergency Medicine | Admitting: Emergency Medicine

## 2021-05-17 ENCOUNTER — Encounter: Payer: Self-pay | Admitting: Radiology

## 2021-05-17 ENCOUNTER — Emergency Department: Payer: Self-pay

## 2021-05-17 DIAGNOSIS — R062 Wheezing: Secondary | ICD-10-CM | POA: Insufficient documentation

## 2021-05-17 DIAGNOSIS — F17210 Nicotine dependence, cigarettes, uncomplicated: Secondary | ICD-10-CM | POA: Insufficient documentation

## 2021-05-17 DIAGNOSIS — T40411A Poisoning by fentanyl or fentanyl analogs, accidental (unintentional), initial encounter: Secondary | ICD-10-CM | POA: Insufficient documentation

## 2021-05-17 DIAGNOSIS — Z20822 Contact with and (suspected) exposure to covid-19: Secondary | ICD-10-CM | POA: Insufficient documentation

## 2021-05-17 DIAGNOSIS — N9489 Other specified conditions associated with female genital organs and menstrual cycle: Secondary | ICD-10-CM | POA: Insufficient documentation

## 2021-05-17 MED ORDER — IPRATROPIUM-ALBUTEROL 0.5-2.5 (3) MG/3ML IN SOLN
3.0000 mL | Freq: Once | RESPIRATORY_TRACT | Status: AC
  Administered 2021-05-18: 03:00:00 3 mL via RESPIRATORY_TRACT
  Filled 2021-05-17: qty 3

## 2021-05-17 NOTE — ED Triage Notes (Signed)
Patient from home with spouse reports shortness of breath/ mentation after "doing 2 lines of Fentanyl" ; spouse reports giving "a spray of Narcan" per EMS staff  EMS: X2 Duonebs CBG 135 O2 88% RA 141/88 Temp 99.0 axillary

## 2021-05-18 LAB — BASIC METABOLIC PANEL
Anion gap: 7 (ref 5–15)
BUN: 7 mg/dL (ref 6–20)
CO2: 26 mmol/L (ref 22–32)
Calcium: 8.8 mg/dL — ABNORMAL LOW (ref 8.9–10.3)
Chloride: 101 mmol/L (ref 98–111)
Creatinine, Ser: 0.45 mg/dL (ref 0.44–1.00)
GFR, Estimated: >60 mL/min (ref >60)
Glucose, Bld: 109 mg/dL — ABNORMAL HIGH (ref 70–99)
Potassium: 3.8 mmol/L (ref 3.5–5.1)
Sodium: 134 mmol/L — ABNORMAL LOW (ref 135–145)

## 2021-05-18 LAB — CBC WITH DIFFERENTIAL/PLATELET
Abs Immature Granulocytes: 0.04 10*3/uL (ref 0.00–0.07)
Basophils Absolute: 0 10*3/uL (ref 0.0–0.1)
Basophils Relative: 0 %
Eosinophils Absolute: 0.4 10*3/uL (ref 0.0–0.5)
Eosinophils Relative: 4 %
HCT: 35.3 % — ABNORMAL LOW (ref 36.0–46.0)
Hemoglobin: 12.6 g/dL (ref 12.0–15.0)
Immature Granulocytes: 0 %
Lymphocytes Relative: 10 %
Lymphs Abs: 1 10*3/uL (ref 0.7–4.0)
MCH: 31.3 pg (ref 26.0–34.0)
MCHC: 35.7 g/dL (ref 30.0–36.0)
MCV: 87.8 fL (ref 80.0–100.0)
Monocytes Absolute: 1.1 10*3/uL — ABNORMAL HIGH (ref 0.1–1.0)
Monocytes Relative: 10 %
Neutro Abs: 7.6 10*3/uL (ref 1.7–7.7)
Neutrophils Relative %: 76 %
Platelets: 199 10*3/uL (ref 150–400)
RBC: 4.02 MIL/uL (ref 3.87–5.11)
RDW: 11.4 % — ABNORMAL LOW (ref 11.5–15.5)
WBC: 10.1 10*3/uL (ref 4.0–10.5)
nRBC: 0 % (ref 0.0–0.2)

## 2021-05-18 LAB — RESP PANEL BY RT-PCR (FLU A&B, COVID) ARPGX2
Influenza A by PCR: NEGATIVE
Influenza B by PCR: NEGATIVE
SARS Coronavirus 2 by RT PCR: NEGATIVE

## 2021-05-18 LAB — HCG, QUANTITATIVE, PREGNANCY: hCG, Beta Chain, Quant, S: 1 m[IU]/mL (ref <5)

## 2021-05-18 LAB — TROPONIN I (HIGH SENSITIVITY): Troponin I (High Sensitivity): <2 ng/L (ref <18)

## 2021-05-18 NOTE — ED Provider Notes (Signed)
[facility] Emergency Department Provider Note  ____________________________________________  Time seen: Approximately 1:21 AM  I have reviewed the triage vital signs and the nursing notes.   HISTORY  Chief Complaint No chief complaint on file.   HPI Michele Hunt is a 25 y.o. female with a history of fentanyl abuse who presents for evaluation of shortness of breath.  Apparently patient's spouse called 911 after administering Narcan to the patient.  Per spouse patient started complaining of shortness of breath after doing 2 lines of fentanyl.  There is no report of respiratory arrest or CPR.  Patient reports that she remembers doing fentanyl and then waking up vomiting after the narcan. When EMS arrived patient was alert and oriented complaining of difficulty breathing and wheezing.  Patient does report vomiting and choking on it after receiving Narcan.  She received 2 DuoNeb's in route and reports improvement of her symptoms.  She does report 2 days of chills, body aches, and cough productive of clear sputum.  She denies chest pain, vomiting or diarrhea.  She denies any history of asthma.  Patient denies IV drug use and reports that she is snorts fentanyl.   Past Medical History:  Diagnosis Date   History of chicken pox 1997    Patient Active Problem List   Diagnosis Date Noted   Dysplasia of cervix, high grade CIN 2 12/03/2019   Irritable bowel syndrome 09/03/2017   Allergic rhinitis 05/01/2015   Anxiety 04/17/2015   Panic attack 04/17/2015    Past Surgical History:  Procedure Laterality Date   Cervical Lymph Node Removed     Neck   Stye Removed     WISDOM TOOTH EXTRACTION  2013    Prior to Admission medications   Not on File    Allergies Ciprofloxacin  Family History  Problem Relation Age of Onset   Heart Problems Mother    Hodgkin's lymphoma Mother    Hypertension Father    Breast cancer Neg Hx     Social History Social History    Tobacco Use   Smoking status: Every Day    Packs/day: 0.75    Types: Cigarettes   Smokeless tobacco: Never  Vaping Use   Vaping Use: Never used  Substance Use Topics   Alcohol use: Yes    Alcohol/week: 4.0 standard drinks    Types: 4 Shots of liquor per week   Drug use: No    Review of Systems  Constitutional: Negative for fever. Eyes: Negative for visual changes. ENT: Negative for sore throat. Neck: No neck pain  Cardiovascular: Negative for chest pain. Respiratory: + shortness of breath, wheezing Gastrointestinal: Negative for abdominal pain,  diarrhea. + vomiting Genitourinary: Negative for dysuria. Musculoskeletal: Negative for back pain. Skin: Negative for rash. Neurological: Negative for headaches, weakness or numbness. Psych: No SI or HI  ____________________________________________   PHYSICAL EXAM:  VITAL SIGNS: ED Triage Vitals [05/17/21 2331]  Enc Vitals Group     BP 131/75     Pulse Rate (!) 105     Resp 17     Temp 98.9 F (37.2 C)     Temp Source Oral     SpO2 94 %     Weight      Height      Head Circumference      Peak Flow      Pain Score 0     Pain Loc      Pain Edu?      Excl.  in GC?     Constitutional: Alert and oriented. Well appearing and in no apparent distress. HEENT:      Head: Normocephalic and atraumatic.         Eyes: Conjunctivae are normal. Sclera is non-icteric.       Mouth/Throat: Mucous membranes are moist.       Neck: Supple with no signs of meningismus. Cardiovascular: Regular rate and rhythm. No murmurs, gallops, or rubs. 2+ symmetrical distal pulses are present in all extremities. No JVD. Respiratory: Normal respiratory effort.  Good air movement with expiratory wheezes bilaterally Gastrointestinal: Soft, non tender, and non distended with positive bowel sounds. No rebound or guarding. Genitourinary: No CVA tenderness. Musculoskeletal:  No edema, cyanosis, or erythema of extremities. Neurologic: Normal speech and  language. Face is symmetric. Moving all extremities. No gross focal neurologic deficits are appreciated. Skin: Skin is warm, dry and intact. No rash noted. Psychiatric: Mood and affect are normal. Speech and behavior are normal.  ____________________________________________   LABS (all labs ordered are listed, but only abnormal results are displayed)  Labs Reviewed  CBC WITH DIFFERENTIAL/PLATELET - Abnormal; Notable for the following components:      Result Value   HCT 35.3 (*)    RDW 11.4 (*)    Monocytes Absolute 1.1 (*)    All other components within normal limits  BASIC METABOLIC PANEL - Abnormal; Notable for the following components:   Sodium 134 (*)    Glucose, Bld 109 (*)    Calcium 8.8 (*)    All other components within normal limits  RESP PANEL BY RT-PCR (FLU A&B, COVID) ARPGX2  HCG, QUANTITATIVE, PREGNANCY  TROPONIN I (HIGH SENSITIVITY)  TROPONIN I (HIGH SENSITIVITY)   ____________________________________________  EKG  ED ECG REPORT I, Nita Sickle, the attending physician, personally viewed and interpreted this ECG.  Normal sinus rhythm with a rate of 88, normal intervals, normal axis, no ST elevations or depressions ____________________________________________  RADIOLOGY  I have personally reviewed the images performed during this visit and I agree with the Radiologist's read.   Interpretation by Radiologist:  DG Chest 2 View  Result Date: 05/18/2021 CLINICAL DATA:  Cough and wheezing. EXAM: CHEST - 2 VIEW COMPARISON:  Chest radiograph dated 09/03/2017 FINDINGS: Faint interstitial densities at the right lung base may be chronic or atelectasis. Developing infiltrate is less likely but not excluded clinical correlation is recommended. No focal consolidation, pleural effusion, pneumothorax. The cardiac silhouette is within normal limits. No acute osseous pathology. IMPRESSION: Chronic changes at the right lung base. No focal consolidation. Electronically  Signed   By: Elgie Collard M.D.   On: 05/18/2021 00:24     ____________________________________________   PROCEDURES  Procedure(s) performed:yes .1-3 Lead EKG Interpretation Performed by: Nita Sickle, MD Authorized by: Nita Sickle, MD     Interpretation: normal     ECG rate assessment: normal     Rhythm: sinus rhythm     Ectopy: none     Conduction: normal     Critical Care performed:  None ____________________________________________   INITIAL IMPRESSION / ASSESSMENT AND PLAN / ED COURSE  25 y.o. female with a history of fentanyl abuse who presents for evaluation of shortness of breath, wheezing after snorting fentanyl and receiving narcan per husband. Patient vomited and possibly aspirated after narcan but also reports chills, body aches, and cough for the last few days.  Patient arrives hemodynamically stable, alert and oriented x3 with a GCS of 15, normal work of breathing and normal sats although  she does have some wheezing bilaterally.  No fever here.  Ddx respiratory depression/ arrest with possible aspiration after narcan vs viral syndrome, bronchitis, PNA. PERC negative  Patient placed on telemetry for close monitoring of cardiorespiratory status.  We will continue to monitor closely for any indication of read dosing of Narcan needed.  We will give duo nebs.  We will get a chest x-ray, viral panel, and basic blood work.  Old medical records reviewed.  _________________________ 4:05 AM on 05/18/2021 -----------------------------------------  COVID and flu negative.  Pregnancy test negative.  Chest x-ray visualized by me with no signs of pneumonia, or aspiration.  Confirmed by radiology.  Labs with no signs of sepsis.  After breathing treatments patient is no longer wheezing, moving good air and satting well on room air.  Lab work otherwise with no acute findings.  At this time she is stable for discharge home to the care of her husband.  Patient has been  monitored for 5 hours post Narcan with no further episodes of respiratory depression.  Did discuss the dangers associated with fentanyl use     _____________________________________________ Please note:  Patient was evaluated in Emergency Department today for the symptoms described in the history of present illness. Patient was evaluated in the context of the global COVID-19 pandemic, which necessitated consideration that the patient might be at risk for infection with the SARS-CoV-2 virus that causes COVID-19. Institutional protocols and algorithms that pertain to the evaluation of patients at risk for COVID-19 are in a state of rapid change based on information released by regulatory bodies including the CDC and federal and state organizations. These policies and algorithms were followed during the patient's care in the ED.  Some ED evaluations and interventions may be delayed as a result of limited staffing during the pandemic.   [place] Controlled Substance Database was reviewed by me. ____________________________________________   FINAL CLINICAL IMPRESSION(S) / ED DIAGNOSES   Final diagnoses:  Accidental fentanyl overdose, initial encounter (HCC)  Wheezing      NEW MEDICATIONS STARTED DURING THIS VISIT:  ED Discharge Orders     None        Note:  This document was prepared using Dragon voice recognition software and may include unintentional dictation errors.    Nita Sickle, MD 05/18/21 [phone]

## 2021-05-27 ENCOUNTER — Telehealth: Payer: Self-pay

## 2021-05-27 NOTE — Telephone Encounter (Signed)
Telephone call to patient regarding the need for a repeat PAP and PE due 05/2021.  Left a message to please return my call at [phone].  PAP letter mailed today. Hart Carwin, RN

## 2021-06-02 ENCOUNTER — Telehealth: Payer: Self-pay

## 2021-06-02 NOTE — Telephone Encounter (Signed)
Telephone call to patient today regarding the need for a repeat PAP due 04/2021.  Voicemail is full.  No response to message left on 05/27/2021 nor the PAP letter mailed on 05/27/2021 as well. Hart Carwin, RN

## 2021-06-11 NOTE — Telephone Encounter (Signed)
Telephone call to patient today regarding the need for a repeat PAP due 04-2021.  No response to other messages left nor PAP letter mailed 05/27/2021. Hart Carwin, RN

## 2021-06-18 NOTE — Telephone Encounter (Signed)
No response by patient to letter mailed 05/27/2021 or message left as well. Will close to PAP f/u until patient initiates contact with [facility]. Hart Carwin, RN

## 2022-02-22 ENCOUNTER — Emergency Department: Payer: No Typology Code available for payment source

## 2022-02-22 ENCOUNTER — Emergency Department
Admission: EM | Admit: 2022-02-22 | Discharge: 2022-02-22 | Disposition: A | Discharge status: Home / Self Care | Payer: No Typology Code available for payment source | Attending: Emergency Medicine | Admitting: Emergency Medicine

## 2022-02-22 ENCOUNTER — Other Ambulatory Visit: Payer: Self-pay

## 2022-02-22 DIAGNOSIS — S59911A Unspecified injury of right forearm, initial encounter: Secondary | ICD-10-CM | POA: Diagnosis present

## 2022-02-22 DIAGNOSIS — S90812A Abrasion, left foot, initial encounter: Secondary | ICD-10-CM | POA: Insufficient documentation

## 2022-02-22 DIAGNOSIS — Y92410 Unspecified street and highway as the place of occurrence of the external cause: Secondary | ICD-10-CM | POA: Diagnosis not present

## 2022-02-22 DIAGNOSIS — N390 Urinary tract infection, site not specified: Secondary | ICD-10-CM

## 2022-02-22 DIAGNOSIS — Z23 Encounter for immunization: Secondary | ICD-10-CM | POA: Insufficient documentation

## 2022-02-22 DIAGNOSIS — S5011XA Contusion of right forearm, initial encounter: Secondary | ICD-10-CM | POA: Insufficient documentation

## 2022-02-22 DIAGNOSIS — T07XXXA Unspecified multiple injuries, initial encounter: Secondary | ICD-10-CM

## 2022-02-22 LAB — URINALYSIS, ROUTINE W REFLEX MICROSCOPIC
Bilirubin Urine: NEGATIVE
Glucose, UA: NEGATIVE mg/dL
Hgb urine dipstick: NEGATIVE
Ketones, ur: NEGATIVE mg/dL
Nitrite: POSITIVE — AB
Protein, ur: NEGATIVE mg/dL
Specific Gravity, Urine: 1.014 (ref 1.005–1.030)
pH: 6 (ref 5.0–8.0)

## 2022-02-22 MED ORDER — OXYCODONE-ACETAMINOPHEN 5-325 MG PO TABS
1.0000 | ORAL_TABLET | Freq: Once | ORAL | Status: AC
Start: 2022-02-22 — End: 2022-02-22
  Administered 2022-02-22: 1 via ORAL
  Filled 2022-02-22: qty 1

## 2022-02-22 MED ORDER — TETANUS-DIPHTH-ACELL PERTUSSIS 5-2.5-18.5 LF-MCG/0.5 IM SUSY
0.5000 mL | PREFILLED_SYRINGE | Freq: Once | INTRAMUSCULAR | Status: AC
  Administered 2022-02-22: 0.5 mL via INTRAMUSCULAR
  Filled 2022-02-22: qty 0.5

## 2022-02-22 MED ORDER — TRIPLE ANTIBIOTIC 3.5-400-5000 EX OINT: 1.0000 | TOPICAL_OINTMENT | Freq: Once | CUTANEOUS | Status: DC

## 2022-02-22 MED ORDER — BACITRACIN ZINC 500 UNIT/GM EX OINT
TOPICAL_OINTMENT | CUTANEOUS | Status: AC
  Administered 2022-02-22: 5
  Filled 2022-02-22: qty 2.7

## 2022-02-22 MED ORDER — BACITRACIN ZINC 500 UNIT/GM EX OINT
TOPICAL_OINTMENT | CUTANEOUS | Status: AC
  Administered 2022-02-22: 3
  Filled 2022-02-22: qty 4.5

## 2022-02-22 MED ORDER — KETOROLAC TROMETHAMINE 30 MG/ML IJ SOLN
30.0000 mg | Freq: Once | INTRAMUSCULAR | Status: AC
  Administered 2022-02-22: 30 mg via INTRAMUSCULAR
  Filled 2022-02-22: qty 1

## 2022-02-22 MED ORDER — PHENAZOPYRIDINE HCL 200 MG PO TABS: 200.0000 mg | ORAL_TABLET | Freq: Three times a day (TID) | ORAL | 0 refills | Status: DC | PRN

## 2022-02-22 MED ORDER — OXYCODONE-ACETAMINOPHEN 7.5-325 MG PO TABS: 1.0000 | ORAL_TABLET | Freq: Four times a day (QID) | ORAL | 0 refills | Status: DC | PRN

## 2022-02-22 MED ORDER — CEFDINIR 300 MG PO CAPS: 300.0000 mg | ORAL_CAPSULE | Freq: Two times a day (BID) | ORAL | 0 refills | Status: AC
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
## 2022-02-22 NOTE — Discharge Instructions (Signed)
Follow-up with your primary care provider if any continued problems or concerns and if your urinary tract infection does not seem to be improving.  A prescription for oxycodone was sent to the pharmacy to take every 6 hours if needed for pain.  Do not drive or operate machinery while taking this medication as it could cause drowsiness.  2 medications were sent in for your urinary tract infection 1 being Pyridium which will take the place of the Azo that you have been taking.  The other is for the antibiotic which is twice a day for the next 10 days.  Increase fluids.  You can expect to be sore for approximately 4 to 5 days.  Also watch the abrasions for any signs of infection.  Use your crutches as needed and also ice and elevate your foot to reduce swelling which will also help with pain.

## 2022-02-22 NOTE — ED Notes (Signed)
Patient taken to xray.
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
## 2022-04-02 ENCOUNTER — Inpatient Hospital Stay (HOSPITAL_COMMUNITY): Payer: No Typology Code available for payment source

## 2022-04-02 ENCOUNTER — Other Ambulatory Visit: Payer: Self-pay

## 2022-04-02 ENCOUNTER — Inpatient Hospital Stay (HOSPITAL_COMMUNITY): Payer: No Typology Code available for payment source | Admitting: Anesthesiology

## 2022-04-02 ENCOUNTER — Emergency Department (HOSPITAL_COMMUNITY): Payer: No Typology Code available for payment source

## 2022-04-02 ENCOUNTER — Encounter (HOSPITAL_COMMUNITY)
Admission: EM | Disposition: A | Discharge status: Home / Self Care | Payer: Self-pay | Source: Home / Self Care | Attending: Orthopedic Surgery

## 2022-04-02 ENCOUNTER — Inpatient Hospital Stay (HOSPITAL_COMMUNITY)
Admission: EM | Admit: 2022-04-02 | Discharge: 2022-04-06 | DRG: 956 | Disposition: A | Discharge status: Home / Self Care | Payer: No Typology Code available for payment source | Attending: Orthopedic Surgery | Admitting: Orthopedic Surgery

## 2022-04-02 ENCOUNTER — Encounter (HOSPITAL_COMMUNITY): Payer: Self-pay

## 2022-04-02 DIAGNOSIS — E876 Hypokalemia: Secondary | ICD-10-CM | POA: Diagnosis present

## 2022-04-02 DIAGNOSIS — D62 Acute posthemorrhagic anemia: Secondary | ICD-10-CM | POA: Diagnosis not present

## 2022-04-02 DIAGNOSIS — Z87440 Personal history of urinary (tract) infections: Secondary | ICD-10-CM | POA: Diagnosis not present

## 2022-04-02 DIAGNOSIS — R509 Fever, unspecified: Secondary | ICD-10-CM | POA: Diagnosis present

## 2022-04-02 DIAGNOSIS — S7221XA Displaced subtrochanteric fracture of right femur, initial encounter for closed fracture: Secondary | ICD-10-CM | POA: Diagnosis not present

## 2022-04-02 DIAGNOSIS — F17210 Nicotine dependence, cigarettes, uncomplicated: Secondary | ICD-10-CM | POA: Diagnosis present

## 2022-04-02 DIAGNOSIS — R630 Anorexia: Secondary | ICD-10-CM | POA: Diagnosis not present

## 2022-04-02 DIAGNOSIS — M79652 Pain in left thigh: Secondary | ICD-10-CM | POA: Insufficient documentation

## 2022-04-02 DIAGNOSIS — Z881 Allergy status to other antibiotic agents status: Secondary | ICD-10-CM | POA: Diagnosis not present

## 2022-04-02 DIAGNOSIS — F17290 Nicotine dependence, other tobacco product, uncomplicated: Secondary | ICD-10-CM | POA: Diagnosis present

## 2022-04-02 DIAGNOSIS — F32A Depression, unspecified: Secondary | ICD-10-CM | POA: Diagnosis not present

## 2022-04-02 DIAGNOSIS — S72042A Displaced fracture of base of neck of left femur, initial encounter for closed fracture: Secondary | ICD-10-CM

## 2022-04-02 DIAGNOSIS — Z8249 Family history of ischemic heart disease and other diseases of the circulatory system: Secondary | ICD-10-CM

## 2022-04-02 DIAGNOSIS — F10920 Alcohol use, unspecified with intoxication, uncomplicated: Secondary | ICD-10-CM

## 2022-04-02 DIAGNOSIS — Y906 Blood alcohol level of 120-199 mg/100 ml: Secondary | ICD-10-CM | POA: Diagnosis present

## 2022-04-02 DIAGNOSIS — Z807 Family history of other malignant neoplasms of lymphoid, hematopoietic and related tissues: Secondary | ICD-10-CM

## 2022-04-02 DIAGNOSIS — S32592A Other specified fracture of left pubis, initial encounter for closed fracture: Secondary | ICD-10-CM

## 2022-04-02 DIAGNOSIS — Y92410 Unspecified street and highway as the place of occurrence of the external cause: Secondary | ICD-10-CM | POA: Diagnosis not present

## 2022-04-02 DIAGNOSIS — K5903 Drug induced constipation: Secondary | ICD-10-CM | POA: Diagnosis not present

## 2022-04-02 DIAGNOSIS — S3210XA Unspecified fracture of sacrum, initial encounter for closed fracture: Secondary | ICD-10-CM

## 2022-04-02 DIAGNOSIS — S7222XD Displaced subtrochanteric fracture of left femur, subsequent encounter for closed fracture with routine healing: Secondary | ICD-10-CM | POA: Diagnosis present

## 2022-04-02 DIAGNOSIS — S7222XA Displaced subtrochanteric fracture of left femur, initial encounter for closed fracture: Principal | ICD-10-CM | POA: Diagnosis present

## 2022-04-02 DIAGNOSIS — F10120 Alcohol abuse with intoxication, uncomplicated: Secondary | ICD-10-CM | POA: Diagnosis present

## 2022-04-02 DIAGNOSIS — E559 Vitamin D deficiency, unspecified: Secondary | ICD-10-CM | POA: Diagnosis present

## 2022-04-02 DIAGNOSIS — S72352A Displaced comminuted fracture of shaft of left femur, initial encounter for closed fracture: Secondary | ICD-10-CM | POA: Diagnosis present

## 2022-04-02 DIAGNOSIS — S32811A Multiple fractures of pelvis with unstable disruption of pelvic ring, initial encounter for closed fracture: Secondary | ICD-10-CM | POA: Diagnosis present

## 2022-04-02 DIAGNOSIS — D72829 Elevated white blood cell count, unspecified: Secondary | ICD-10-CM | POA: Diagnosis present

## 2022-04-02 DIAGNOSIS — S80211A Abrasion, right knee, initial encounter: Secondary | ICD-10-CM | POA: Diagnosis present

## 2022-04-02 DIAGNOSIS — K5900 Constipation, unspecified: Secondary | ICD-10-CM | POA: Diagnosis present

## 2022-04-02 DIAGNOSIS — Z87410 Personal history of cervical dysplasia: Secondary | ICD-10-CM | POA: Diagnosis not present

## 2022-04-02 DIAGNOSIS — M7989 Other specified soft tissue disorders: Secondary | ICD-10-CM | POA: Diagnosis not present

## 2022-04-02 DIAGNOSIS — S32512A Fracture of superior rim of left pubis, initial encounter for closed fracture: Secondary | ICD-10-CM

## 2022-04-02 DIAGNOSIS — F4320 Adjustment disorder, unspecified: Secondary | ICD-10-CM | POA: Diagnosis not present

## 2022-04-02 DIAGNOSIS — S32129D Unspecified Zone II fracture of sacrum, subsequent encounter for fracture with routine healing: Secondary | ICD-10-CM | POA: Diagnosis not present

## 2022-04-02 DIAGNOSIS — D696 Thrombocytopenia, unspecified: Secondary | ICD-10-CM | POA: Diagnosis present

## 2022-04-02 DIAGNOSIS — R300 Dysuria: Secondary | ICD-10-CM | POA: Diagnosis present

## 2022-04-02 DIAGNOSIS — S32119A Unspecified Zone I fracture of sacrum, initial encounter for closed fracture: Secondary | ICD-10-CM | POA: Diagnosis present

## 2022-04-02 DIAGNOSIS — R000 Tachycardia, unspecified: Secondary | ICD-10-CM | POA: Diagnosis not present

## 2022-04-02 DIAGNOSIS — R609 Edema, unspecified: Secondary | ICD-10-CM | POA: Diagnosis not present

## 2022-04-02 DIAGNOSIS — T07XXXA Unspecified multiple injuries, initial encounter: Secondary | ICD-10-CM | POA: Diagnosis not present

## 2022-04-02 DIAGNOSIS — S32592D Other specified fracture of left pubis, subsequent encounter for fracture with routine healing: Secondary | ICD-10-CM | POA: Diagnosis not present

## 2022-04-02 DIAGNOSIS — R102 Pelvic and perineal pain: Secondary | ICD-10-CM | POA: Diagnosis not present

## 2022-04-02 DIAGNOSIS — R0989 Other specified symptoms and signs involving the circulatory and respiratory systems: Secondary | ICD-10-CM | POA: Diagnosis present

## 2022-04-02 DIAGNOSIS — S72002A Fracture of unspecified part of neck of left femur, initial encounter for closed fracture: Secondary | ICD-10-CM

## 2022-04-02 DIAGNOSIS — F10929 Alcohol use, unspecified with intoxication, unspecified: Secondary | ICD-10-CM | POA: Diagnosis present

## 2022-04-02 HISTORY — PX: FEMUR IM NAIL: SHX1597

## 2022-04-02 HISTORY — PX: ORIF PELVIC FRACTURE: SHX2128

## 2022-04-02 LAB — LACTIC ACID, PLASMA: Lactic Acid, Venous: 3.4 mmol/L (ref 0.5–1.9)

## 2022-04-02 LAB — CBC
HCT: 33.2 % — ABNORMAL LOW (ref 36.0–46.0)
HCT: 37.2 % (ref 36.0–46.0)
Hemoglobin: 11.8 g/dL — ABNORMAL LOW (ref 12.0–15.0)
Hemoglobin: 12.9 g/dL (ref 12.0–15.0)
MCH: 33.3 pg (ref 26.0–34.0)
MCH: 33.9 pg (ref 26.0–34.0)
MCHC: 34.7 g/dL (ref 30.0–36.0)
MCHC: 35.5 g/dL (ref 30.0–36.0)
MCV: 95.4 fL (ref 80.0–100.0)
MCV: 96.1 fL (ref 80.0–100.0)
Platelets: 140 10*3/uL — ABNORMAL LOW (ref 150–400)
Platelets: 168 10*3/uL (ref 150–400)
RBC: 3.48 MIL/uL — ABNORMAL LOW (ref 3.87–5.11)
RBC: 3.87 MIL/uL (ref 3.87–5.11)
RDW: 11.9 % (ref 11.5–15.5)
RDW: 12.1 % (ref 11.5–15.5)
WBC: 12.3 10*3/uL — ABNORMAL HIGH (ref 4.0–10.5)
WBC: 12.8 10*3/uL — ABNORMAL HIGH (ref 4.0–10.5)
nRBC: 0 % (ref 0.0–0.2)
nRBC: 0 % (ref 0.0–0.2)

## 2022-04-02 LAB — CREATININE, SERUM
Creatinine, Ser: 0.74 mg/dL (ref 0.44–1.00)
GFR, Estimated: >60 mL/min (ref >60)

## 2022-04-02 LAB — BASIC METABOLIC PANEL
Anion gap: 15 (ref 5–15)
BUN: 11 mg/dL (ref 6–20)
CO2: 18 mmol/L — ABNORMAL LOW (ref 22–32)
Calcium: 8.6 mg/dL — ABNORMAL LOW (ref 8.9–10.3)
Chloride: 106 mmol/L (ref 98–111)
Creatinine, Ser: 0.81 mg/dL (ref 0.44–1.00)
GFR, Estimated: >60 mL/min (ref >60)
Glucose, Bld: 100 mg/dL — ABNORMAL HIGH (ref 70–99)
Potassium: 3.1 mmol/L — ABNORMAL LOW (ref 3.5–5.1)
Sodium: 139 mmol/L (ref 135–145)

## 2022-04-02 LAB — URINALYSIS, ROUTINE W REFLEX MICROSCOPIC
Bilirubin Urine: NEGATIVE
Glucose, UA: NEGATIVE mg/dL
Ketones, ur: NEGATIVE mg/dL
Leukocytes,Ua: NEGATIVE
Nitrite: NEGATIVE
Protein, ur: 100 mg/dL — AB
RBC / HPF: >50 RBC/hpf — ABNORMAL HIGH (ref 0–5)
Specific Gravity, Urine: 1.028 (ref 1.005–1.030)
pH: 6 (ref 5.0–8.0)

## 2022-04-02 LAB — ETHANOL: Alcohol, Ethyl (B): 145 mg/dL — ABNORMAL HIGH (ref <10)

## 2022-04-02 LAB — SURGICAL PCR SCREEN
MRSA, PCR: NEGATIVE
Staphylococcus aureus: NEGATIVE

## 2022-04-02 LAB — CBG MONITORING, ED: Glucose-Capillary: 116 mg/dL — ABNORMAL HIGH (ref 70–99)

## 2022-04-02 LAB — COMPREHENSIVE METABOLIC PANEL
ALT: 34 U/L (ref 0–44)
AST: 51 U/L — ABNORMAL HIGH (ref 15–41)
Albumin: 3.8 g/dL (ref 3.5–5.0)
Alkaline Phosphatase: 56 U/L (ref 38–126)
Anion gap: 10 (ref 5–15)
BUN: 11 mg/dL (ref 6–20)
CO2: 22 mmol/L (ref 22–32)
Calcium: 8.7 mg/dL — ABNORMAL LOW (ref 8.9–10.3)
Chloride: 105 mmol/L (ref 98–111)
Creatinine, Ser: 0.82 mg/dL (ref 0.44–1.00)
GFR, Estimated: >60 mL/min (ref >60)
Glucose, Bld: 102 mg/dL — ABNORMAL HIGH (ref 70–99)
Potassium: 3.1 mmol/L — ABNORMAL LOW (ref 3.5–5.1)
Sodium: 137 mmol/L (ref 135–145)
Total Bilirubin: 0.5 mg/dL (ref 0.3–1.2)
Total Protein: 6.3 g/dL — ABNORMAL LOW (ref 6.5–8.1)

## 2022-04-02 LAB — I-STAT CHEM 8, ED
BUN: 11 mg/dL (ref 6–20)
Calcium, Ion: 1.03 mmol/L — ABNORMAL LOW (ref 1.15–1.40)
Chloride: 105 mmol/L (ref 98–111)
Creatinine, Ser: 0.9 mg/dL (ref 0.44–1.00)
Glucose, Bld: 98 mg/dL (ref 70–99)
HCT: 38 % (ref 36.0–46.0)
Hemoglobin: 12.9 g/dL (ref 12.0–15.0)
Potassium: 3.1 mmol/L — ABNORMAL LOW (ref 3.5–5.1)
Sodium: 139 mmol/L (ref 135–145)
TCO2: 21 mmol/L — ABNORMAL LOW (ref 22–32)

## 2022-04-02 LAB — I-STAT BETA HCG BLOOD, ED (MC, WL, AP ONLY): I-stat hCG, quantitative: <5 m[IU]/mL (ref <5)

## 2022-04-02 LAB — ABO/RH: ABO/RH(D): B POS

## 2022-04-02 LAB — TYPE AND SCREEN
ABO/RH(D): B POS
Antibody Screen: NEGATIVE

## 2022-04-02 LAB — MAGNESIUM: Magnesium: 2 mg/dL (ref 1.7–2.4)

## 2022-04-02 LAB — PROTIME-INR
INR: 1 (ref 0.8–1.2)
Prothrombin Time: 13.3 seconds (ref 11.4–15.2)

## 2022-04-02 LAB — HIV ANTIBODY (ROUTINE TESTING W REFLEX): HIV Screen 4th Generation wRfx: NONREACTIVE

## 2022-04-02 SURGERY — OPEN REDUCTION INTERNAL FIXATION (ORIF) PELVIC FRACTURE
Anesthesia: General | Site: Leg Upper | Laterality: Left

## 2022-04-02 MED ORDER — ACETAMINOPHEN 500 MG PO TABS: 1000.0000 mg | ORAL_TABLET | Freq: Once | ORAL | Status: DC

## 2022-04-02 MED ORDER — LORAZEPAM 1 MG PO TABS: 1.0000 mg | ORAL_TABLET | ORAL | Status: AC | PRN

## 2022-04-02 MED ORDER — THIAMINE MONONITRATE 100 MG PO TABS
100.0000 mg | ORAL_TABLET | Freq: Every day | ORAL | Status: DC
  Administered 2022-04-02 – 2022-04-06 (×5): 100 mg via ORAL
  Filled 2022-04-02 (×5): qty 1

## 2022-04-02 MED ORDER — HYDROMORPHONE HCL 1 MG/ML IJ SOLN
INTRAMUSCULAR | Status: DC | PRN
  Administered 2022-04-02 (×2): .5 mg via INTRAVENOUS

## 2022-04-02 MED ORDER — ROCURONIUM BROMIDE 10 MG/ML (PF) SYRINGE
PREFILLED_SYRINGE | INTRAVENOUS | Status: AC
  Filled 2022-04-02: qty 40

## 2022-04-02 MED ORDER — POVIDONE-IODINE 10 % EX SWAB
2.0000 | Freq: Once | CUTANEOUS | Status: AC
  Administered 2022-04-02: 2 via TOPICAL

## 2022-04-02 MED ORDER — PROPOFOL 10 MG/ML IV BOLUS
INTRAVENOUS | Status: AC
  Filled 2022-04-02: qty 20

## 2022-04-02 MED ORDER — ACETAMINOPHEN 500 MG PO TABS
1000.0000 mg | ORAL_TABLET | Freq: Four times a day (QID) | ORAL | Status: DC
  Administered 2022-04-02 (×2): 1000 mg via ORAL
  Filled 2022-04-02 (×2): qty 2

## 2022-04-02 MED ORDER — BISACODYL 5 MG PO TBEC
5.0000 mg | DELAYED_RELEASE_TABLET | Freq: Every day | ORAL | Status: DC | PRN
  Administered 2022-04-06: 5 mg via ORAL
  Filled 2022-04-02: qty 1

## 2022-04-02 MED ORDER — CHLORHEXIDINE GLUCONATE 0.12 % MT SOLN
OROMUCOSAL | Status: AC
  Filled 2022-04-02: qty 15

## 2022-04-02 MED ORDER — HYDROMORPHONE HCL 1 MG/ML IJ SOLN
0.5000 mg | INTRAMUSCULAR | Status: DC | PRN
  Administered 2022-04-03 – 2022-04-06 (×15): 1 mg via INTRAVENOUS
  Filled 2022-04-02 (×16): qty 1

## 2022-04-02 MED ORDER — LACTATED RINGERS IV SOLN: INTRAVENOUS | Status: DC

## 2022-04-02 MED ORDER — FENTANYL CITRATE (PF) 250 MCG/5ML IJ SOLN
INTRAMUSCULAR | Status: DC | PRN
  Administered 2022-04-02 (×5): 50 ug via INTRAVENOUS

## 2022-04-02 MED ORDER — OXYCODONE HCL 5 MG PO TABS
10.0000 mg | ORAL_TABLET | ORAL | Status: DC | PRN
  Administered 2022-04-02: 15 mg via ORAL
  Filled 2022-04-02: qty 3

## 2022-04-02 MED ORDER — METHOCARBAMOL 500 MG PO TABS
500.0000 mg | ORAL_TABLET | Freq: Four times a day (QID) | ORAL | Status: DC | PRN
  Administered 2022-04-02: 500 mg via ORAL
  Filled 2022-04-02: qty 1

## 2022-04-02 MED ORDER — ADULT MULTIVITAMIN W/MINERALS CH
1.0000 | ORAL_TABLET | Freq: Every day | ORAL | Status: DC
  Administered 2022-04-02 – 2022-04-06 (×5): 1 via ORAL
  Filled 2022-04-02 (×5): qty 1

## 2022-04-02 MED ORDER — HYDROMORPHONE HCL 1 MG/ML IJ SOLN
INTRAMUSCULAR | Status: AC
  Filled 2022-04-02: qty 0.5

## 2022-04-02 MED ORDER — OXYCODONE HCL 5 MG PO TABS: 5.0000 mg | ORAL_TABLET | ORAL | Status: DC | PRN

## 2022-04-02 MED ORDER — OXYCODONE HCL 5 MG PO TABS
10.0000 mg | ORAL_TABLET | ORAL | Status: DC | PRN
  Administered 2022-04-02 – 2022-04-03 (×4): 15 mg via ORAL
  Filled 2022-04-02 (×4): qty 3

## 2022-04-02 MED ORDER — CEFAZOLIN SODIUM-DEXTROSE 2-4 GM/100ML-% IV SOLN
2.0000 g | Freq: Four times a day (QID) | INTRAVENOUS | Status: AC
  Administered 2022-04-02 – 2022-04-03 (×2): 2 g via INTRAVENOUS
  Filled 2022-04-02 (×2): qty 100

## 2022-04-02 MED ORDER — CHLORHEXIDINE GLUCONATE 4 % EX LIQD: 60.0000 mL | Freq: Once | CUTANEOUS | Status: DC

## 2022-04-02 MED ORDER — METAXALONE 800 MG PO TABS
800.0000 mg | ORAL_TABLET | Freq: Three times a day (TID) | ORAL | Status: DC
  Administered 2022-04-02 – 2022-04-03 (×2): 800 mg via ORAL
  Filled 2022-04-02 (×4): qty 1

## 2022-04-02 MED ORDER — DEXAMETHASONE SODIUM PHOSPHATE 10 MG/ML IJ SOLN
INTRAMUSCULAR | Status: AC
  Filled 2022-04-02: qty 1

## 2022-04-02 MED ORDER — HYDROMORPHONE HCL 1 MG/ML IJ SOLN
INTRAMUSCULAR | Status: AC
  Filled 2022-04-02: qty 1

## 2022-04-02 MED ORDER — SCOPOLAMINE 1 MG/3DAYS TD PT72
MEDICATED_PATCH | TRANSDERMAL | Status: AC
  Administered 2022-04-02: 1.5 mg via TRANSDERMAL
  Filled 2022-04-02: qty 1

## 2022-04-02 MED ORDER — PROMETHAZINE HCL 25 MG/ML IJ SOLN
INTRAMUSCULAR | Status: AC
  Filled 2022-04-02: qty 1

## 2022-04-02 MED ORDER — SUGAMMADEX SODIUM 200 MG/2ML IV SOLN
INTRAVENOUS | Status: DC | PRN
  Administered 2022-04-02: 200 mg via INTRAVENOUS

## 2022-04-02 MED ORDER — LORAZEPAM 1 MG PO TABS
0.0000 mg | ORAL_TABLET | Freq: Two times a day (BID) | ORAL | Status: DC
  Filled 2022-04-02: qty 2

## 2022-04-02 MED ORDER — SCOPOLAMINE 1 MG/3DAYS TD PT72: 1.0000 | MEDICATED_PATCH | TRANSDERMAL | Status: DC

## 2022-04-02 MED ORDER — MORPHINE SULFATE (PF) 4 MG/ML IV SOLN
4.0000 mg | INTRAVENOUS | Status: AC | PRN
  Administered 2022-04-02 (×4): 4 mg via INTRAVENOUS
  Filled 2022-04-02 (×4): qty 1

## 2022-04-02 MED ORDER — ONDANSETRON HCL 4 MG PO TABS: 4.0000 mg | ORAL_TABLET | Freq: Four times a day (QID) | ORAL | Status: DC | PRN

## 2022-04-02 MED ORDER — LACTATED RINGERS IV SOLN: INTRAVENOUS | Status: DC | PRN

## 2022-04-02 MED ORDER — ONDANSETRON HCL 4 MG/2ML IJ SOLN
INTRAMUSCULAR | Status: DC | PRN
  Administered 2022-04-02: 4 mg via INTRAVENOUS

## 2022-04-02 MED ORDER — HYDROMORPHONE HCL 1 MG/ML IJ SOLN
1.0000 mg | Freq: Once | INTRAMUSCULAR | Status: AC
  Administered 2022-04-02: 1 mg via INTRAVENOUS

## 2022-04-02 MED ORDER — OXYCODONE HCL 5 MG PO TABS
10.0000 mg | ORAL_TABLET | Freq: Once | ORAL | Status: AC
  Administered 2022-04-02: 10 mg via ORAL
  Filled 2022-04-02: qty 2

## 2022-04-02 MED ORDER — POLYETHYLENE GLYCOL 3350 17 G PO PACK
17.0000 g | PACK | Freq: Every day | ORAL | Status: DC | PRN
  Administered 2022-04-05 – 2022-04-06 (×2): 17 g via ORAL
  Filled 2022-04-02 (×2): qty 1

## 2022-04-02 MED ORDER — METHOCARBAMOL 1000 MG/10ML IJ SOLN: 500.0000 mg | Freq: Four times a day (QID) | INTRAVENOUS | Status: DC | PRN

## 2022-04-02 MED ORDER — MAGNESIUM CITRATE PO SOLN: 1.0000 | Freq: Once | ORAL | Status: DC | PRN

## 2022-04-02 MED ORDER — MORPHINE SULFATE (PF) 2 MG/ML IV SOLN
INTRAVENOUS | Status: AC
  Filled 2022-04-02: qty 2

## 2022-04-02 MED ORDER — METOCLOPRAMIDE HCL 5 MG/ML IJ SOLN: 5.0000 mg | Freq: Three times a day (TID) | INTRAMUSCULAR | Status: DC | PRN

## 2022-04-02 MED ORDER — LIDOCAINE 2% (20 MG/ML) 5 ML SYRINGE
INTRAMUSCULAR | Status: AC
  Filled 2022-04-02: qty 15

## 2022-04-02 MED ORDER — KETAMINE HCL 10 MG/ML IJ SOLN
INTRAMUSCULAR | Status: DC | PRN
  Administered 2022-04-02 (×5): 10 mg via INTRAVENOUS

## 2022-04-02 MED ORDER — CEFAZOLIN SODIUM-DEXTROSE 2-4 GM/100ML-% IV SOLN
INTRAVENOUS | Status: AC
  Filled 2022-04-02: qty 100

## 2022-04-02 MED ORDER — ONDANSETRON HCL 4 MG/2ML IJ SOLN
INTRAMUSCULAR | Status: AC
  Filled 2022-04-02: qty 4

## 2022-04-02 MED ORDER — FENTANYL CITRATE (PF) 100 MCG/2ML IJ SOLN
INTRAMUSCULAR | Status: AC
  Filled 2022-04-02: qty 2

## 2022-04-02 MED ORDER — FOLIC ACID 1 MG PO TABS
1.0000 mg | ORAL_TABLET | Freq: Every day | ORAL | Status: DC
  Administered 2022-04-02 – 2022-04-06 (×5): 1 mg via ORAL
  Filled 2022-04-02 (×5): qty 1

## 2022-04-02 MED ORDER — LORAZEPAM 1 MG PO TABS: 0.0000 mg | ORAL_TABLET | Freq: Four times a day (QID) | ORAL | Status: AC

## 2022-04-02 MED ORDER — HYDROMORPHONE HCL 1 MG/ML IJ SOLN
0.5000 mg | INTRAMUSCULAR | Status: DC | PRN
  Administered 2022-04-02 (×2): 1 mg via INTRAVENOUS
  Filled 2022-04-02 (×2): qty 1

## 2022-04-02 MED ORDER — ROCURONIUM BROMIDE 10 MG/ML (PF) SYRINGE
PREFILLED_SYRINGE | INTRAVENOUS | Status: DC | PRN
  Administered 2022-04-02 (×3): 50 mg via INTRAVENOUS

## 2022-04-02 MED ORDER — HYDROMORPHONE HCL 1 MG/ML IJ SOLN: 0.5000 mg | INTRAMUSCULAR | Status: DC | PRN

## 2022-04-02 MED ORDER — EPHEDRINE 5 MG/ML INJ
INTRAVENOUS | Status: AC
  Filled 2022-04-02: qty 5

## 2022-04-02 MED ORDER — LORAZEPAM 2 MG/ML IJ SOLN: 1.0000 mg | INTRAMUSCULAR | Status: DC | PRN

## 2022-04-02 MED ORDER — ACETAMINOPHEN 500 MG PO TABS
ORAL_TABLET | ORAL | Status: AC
  Filled 2022-04-02: qty 2

## 2022-04-02 MED ORDER — ONDANSETRON HCL 4 MG/2ML IJ SOLN
INTRAMUSCULAR | Status: AC
  Filled 2022-04-02: qty 2

## 2022-04-02 MED ORDER — THIAMINE HCL 100 MG/ML IJ SOLN
100.0000 mg | Freq: Every day | INTRAMUSCULAR | Status: DC
  Filled 2022-04-02: qty 2

## 2022-04-02 MED ORDER — MIDAZOLAM HCL 2 MG/2ML IJ SOLN
INTRAMUSCULAR | Status: DC | PRN
  Administered 2022-04-02: 2 mg via INTRAVENOUS

## 2022-04-02 MED ORDER — OXYCODONE HCL 5 MG/5ML PO SOLN: 5.0000 mg | Freq: Once | ORAL | Status: DC | PRN

## 2022-04-02 MED ORDER — DEXAMETHASONE SODIUM PHOSPHATE 10 MG/ML IJ SOLN
INTRAMUSCULAR | Status: DC | PRN
  Administered 2022-04-02: 10 mg via INTRAVENOUS

## 2022-04-02 MED ORDER — POTASSIUM CHLORIDE 20 MEQ PO PACK
40.0000 meq | PACK | Freq: Once | ORAL | Status: AC
  Administered 2022-04-02: 40 meq via ORAL
  Filled 2022-04-02: qty 2

## 2022-04-02 MED ORDER — LORAZEPAM 2 MG/ML IJ SOLN: 1.0000 mg | INTRAMUSCULAR | Status: AC | PRN

## 2022-04-02 MED ORDER — POTASSIUM CHLORIDE 10 MEQ/100ML IV SOLN
10.0000 meq | INTRAVENOUS | Status: DC
  Administered 2022-04-02: 10 meq via INTRAVENOUS
  Filled 2022-04-02: qty 100

## 2022-04-02 MED ORDER — MORPHINE SULFATE (PF) 4 MG/ML IV SOLN
4.0000 mg | Freq: Once | INTRAVENOUS | Status: DC
  Filled 2022-04-02: qty 1

## 2022-04-02 MED ORDER — FENTANYL CITRATE (PF) 250 MCG/5ML IJ SOLN
INTRAMUSCULAR | Status: AC
  Filled 2022-04-02: qty 5

## 2022-04-02 MED ORDER — ACETAMINOPHEN 500 MG PO TABS
1000.0000 mg | ORAL_TABLET | Freq: Four times a day (QID) | ORAL | Status: DC
  Administered 2022-04-02 – 2022-04-06 (×14): 1000 mg via ORAL
  Filled 2022-04-02 (×15): qty 2

## 2022-04-02 MED ORDER — PHENOL 1.4 % MT LIQD: 1.0000 | OROMUCOSAL | Status: DC | PRN

## 2022-04-02 MED ORDER — 0.9 % SODIUM CHLORIDE (POUR BTL) OPTIME
TOPICAL | Status: DC | PRN
  Administered 2022-04-02: 1000 mL

## 2022-04-02 MED ORDER — PROMETHAZINE HCL 25 MG/ML IJ SOLN: 6.2500 mg | INTRAMUSCULAR | Status: DC | PRN

## 2022-04-02 MED ORDER — PROPOFOL 10 MG/ML IV BOLUS
INTRAVENOUS | Status: DC | PRN
  Administered 2022-04-02: 200 mg via INTRAVENOUS

## 2022-04-02 MED ORDER — ONDANSETRON HCL 4 MG/2ML IJ SOLN: 4.0000 mg | Freq: Four times a day (QID) | INTRAMUSCULAR | Status: DC | PRN

## 2022-04-02 MED ORDER — ACETAMINOPHEN 325 MG PO TABS: 325.0000 mg | ORAL_TABLET | Freq: Four times a day (QID) | ORAL | Status: DC | PRN

## 2022-04-02 MED ORDER — DEXMEDETOMIDINE HCL IN NACL 200 MCG/50ML IV SOLN
INTRAVENOUS | Status: DC | PRN
  Administered 2022-04-02: 8 ug via INTRAVENOUS
  Administered 2022-04-02: 4 ug via INTRAVENOUS
  Administered 2022-04-02: 8 ug via INTRAVENOUS

## 2022-04-02 MED ORDER — FENTANYL CITRATE (PF) 100 MCG/2ML IJ SOLN
25.0000 ug | INTRAMUSCULAR | Status: DC | PRN
  Administered 2022-04-02 (×3): 50 ug via INTRAVENOUS

## 2022-04-02 MED ORDER — KETAMINE HCL 50 MG/5ML IJ SOSY
PREFILLED_SYRINGE | INTRAMUSCULAR | Status: AC
  Filled 2022-04-02: qty 5

## 2022-04-02 MED ORDER — LIDOCAINE 2% (20 MG/ML) 5 ML SYRINGE
INTRAMUSCULAR | Status: DC | PRN
  Administered 2022-04-02: 60 mg via INTRAVENOUS

## 2022-04-02 MED ORDER — MORPHINE SULFATE (PF) 2 MG/ML IV SOLN
INTRAVENOUS | Status: AC
  Administered 2022-04-02: 4 mg via INTRAVENOUS
  Filled 2022-04-02: qty 2

## 2022-04-02 MED ORDER — IOHEXOL 350 MG/ML SOLN
75.0000 mL | Freq: Once | INTRAVENOUS | Status: AC | PRN
  Administered 2022-04-02: 75 mL via INTRAVENOUS

## 2022-04-02 MED ORDER — ENOXAPARIN SODIUM 40 MG/0.4ML IJ SOSY
40.0000 mg | PREFILLED_SYRINGE | INTRAMUSCULAR | Status: DC
  Administered 2022-04-03 – 2022-04-06 (×4): 40 mg via SUBCUTANEOUS
  Filled 2022-04-02 (×4): qty 0.4

## 2022-04-02 MED ORDER — HYDROMORPHONE HCL 1 MG/ML IJ SOLN
0.2500 mg | INTRAMUSCULAR | Status: DC | PRN
  Administered 2022-04-02 (×2): 0.5 mg via INTRAVENOUS

## 2022-04-02 MED ORDER — ALUM & MAG HYDROXIDE-SIMETH 200-200-20 MG/5ML PO SUSP: 30.0000 mL | ORAL | Status: DC | PRN

## 2022-04-02 MED ORDER — DOCUSATE SODIUM 100 MG PO CAPS
100.0000 mg | ORAL_CAPSULE | Freq: Two times a day (BID) | ORAL | Status: DC
  Administered 2022-04-02 – 2022-04-06 (×8): 100 mg via ORAL
  Filled 2022-04-02 (×8): qty 1

## 2022-04-02 MED ORDER — ONDANSETRON HCL 4 MG/2ML IJ SOLN
4.0000 mg | Freq: Four times a day (QID) | INTRAMUSCULAR | Status: DC | PRN
  Administered 2022-04-02: 4 mg via INTRAVENOUS
  Filled 2022-04-02: qty 2

## 2022-04-02 MED ORDER — MIDAZOLAM HCL 2 MG/2ML IJ SOLN
INTRAMUSCULAR | Status: AC
  Filled 2022-04-02: qty 2

## 2022-04-02 MED ORDER — FENTANYL CITRATE (PF) 100 MCG/2ML IJ SOLN: 25.0000 ug | INTRAMUSCULAR | Status: DC | PRN

## 2022-04-02 MED ORDER — SUCCINYLCHOLINE CHLORIDE 200 MG/10ML IV SOSY
PREFILLED_SYRINGE | INTRAVENOUS | Status: DC | PRN
  Administered 2022-04-02: 120 mg via INTRAVENOUS

## 2022-04-02 MED ORDER — METOCLOPRAMIDE HCL 5 MG PO TABS: 5.0000 mg | ORAL_TABLET | Freq: Three times a day (TID) | ORAL | Status: DC | PRN

## 2022-04-02 MED ORDER — MENTHOL 3 MG MT LOZG: 1.0000 | LOZENGE | OROMUCOSAL | Status: DC | PRN

## 2022-04-02 MED ORDER — CEFAZOLIN SODIUM-DEXTROSE 2-4 GM/100ML-% IV SOLN
2.0000 g | INTRAVENOUS | Status: AC
  Administered 2022-04-02: 2 g via INTRAVENOUS

## 2022-04-02 MED ORDER — HYDROMORPHONE HCL 1 MG/ML IJ SOLN
1.0000 mg | Freq: Once | INTRAMUSCULAR | Status: AC
  Administered 2022-04-02: 1 mg via INTRAVENOUS
  Filled 2022-04-02: qty 1

## 2022-04-02 MED ORDER — OXYCODONE HCL 5 MG PO TABS: 5.0000 mg | ORAL_TABLET | Freq: Once | ORAL | Status: DC | PRN

## 2022-04-02 MED ORDER — PHENYLEPHRINE 80 MCG/ML (10ML) SYRINGE FOR IV PUSH (FOR BLOOD PRESSURE SUPPORT)
PREFILLED_SYRINGE | INTRAVENOUS | Status: AC
  Filled 2022-04-02: qty 20

## 2022-04-02 SURGICAL SUPPLY — 75 items
BAG COUNTER SPONGE SURGICOUNT (BAG) ×2 IMPLANT
BAG SPNG CNTER NS LX DISP (BAG) ×2
BIT DRILL 4.8X200 CANN (BIT) IMPLANT
BIT DRILL SHORT 4.2 (BIT) IMPLANT
BIT DRILL STEP 4.5X6.5 (BIT) IMPLANT
BLADE CLIPPER SURG (BLADE) IMPLANT
BNDG COHESIVE 6X5 TAN STRL LF (GAUZE/BANDAGES/DRESSINGS) IMPLANT
BRUSH SCRUB EZ PLAIN DRY (MISCELLANEOUS) ×4 IMPLANT
COVER PERINEAL POST (MISCELLANEOUS) ×2 IMPLANT
COVER SURGICAL LIGHT HANDLE (MISCELLANEOUS) ×4 IMPLANT
DRAIN CHANNEL 15F RND FF W/TCR (WOUND CARE) IMPLANT
DRAPE C-ARM 42X72 X-RAY (DRAPES) ×2 IMPLANT
DRAPE C-ARMOR (DRAPES) ×2 IMPLANT
DRAPE HALF SHEET 40X57 (DRAPES) IMPLANT
DRAPE LAPAROTOMY TRNSV 102X78 (DRAPES) ×2 IMPLANT
DRAPE ORTHO SPLIT 77X108 STRL (DRAPES) ×4
DRAPE SURG 17X23 STRL (DRAPES) ×2 IMPLANT
DRAPE SURG ORHT 6 SPLT 77X108 (DRAPES) ×4 IMPLANT
DRAPE U-SHAPE 47X51 STRL (DRAPES) ×2 IMPLANT
DRESSING MEPILEX FLEX 4X4 (GAUZE/BANDAGES/DRESSINGS) IMPLANT
DRILL BIT SHORT 4.2 (BIT) ×2
DRILL BIT STEP 4.5X6.5 (BIT) ×2
DRSG MEPILEX BORDER 4X4 (GAUZE/BANDAGES/DRESSINGS) ×2 IMPLANT
DRSG MEPILEX BORDER 4X8 (GAUZE/BANDAGES/DRESSINGS) ×2 IMPLANT
DRSG MEPILEX FLEX 4X4 (GAUZE/BANDAGES/DRESSINGS) ×2
DRSG MEPILEX POST OP 4X8 (GAUZE/BANDAGES/DRESSINGS) IMPLANT
ELECT REM PT RETURN 9FT ADLT (ELECTROSURGICAL) ×2
ELECTRODE REM PT RTRN 9FT ADLT (ELECTROSURGICAL) ×2 IMPLANT
EVACUATOR SILICONE 100CC (DRAIN) IMPLANT
GAUZE SPONGE 4X4 12PLY STRL LF (GAUZE/BANDAGES/DRESSINGS) IMPLANT
GLOVE BIO SURGEON STRL SZ7.5 (GLOVE) ×2 IMPLANT
GLOVE BIO SURGEON STRL SZ8 (GLOVE) ×2 IMPLANT
GLOVE BIOGEL PI IND STRL 7.5 (GLOVE) ×2 IMPLANT
GLOVE BIOGEL PI IND STRL 8 (GLOVE) ×2 IMPLANT
GLOVE SURG ORTHO LTX SZ7.5 (GLOVE) ×4 IMPLANT
GOWN STRL REUS W/ TWL LRG LVL3 (GOWN DISPOSABLE) ×4 IMPLANT
GOWN STRL REUS W/ TWL XL LVL3 (GOWN DISPOSABLE) ×2 IMPLANT
GOWN STRL REUS W/TWL LRG LVL3 (GOWN DISPOSABLE) ×4
GOWN STRL REUS W/TWL XL LVL3 (GOWN DISPOSABLE) ×2
GUIDE PIN DRILL TIP 2.8X450HIP (PIN) ×6
GUIDEWIRE 3.2X400 (WIRE) IMPLANT
KIT BASIN OR (CUSTOM PROCEDURE TRAY) ×2 IMPLANT
KIT TURNOVER KIT B (KITS) ×2 IMPLANT
MANIFOLD NEPTUNE II (INSTRUMENTS) ×2 IMPLANT
NAIL CANN FRN 9X360 (Nail) IMPLANT
NS IRRIG 1000ML POUR BTL (IV SOLUTION) ×4 IMPLANT
PACK GENERAL/GYN (CUSTOM PROCEDURE TRAY) ×2 IMPLANT
PACK TOTAL JOINT (CUSTOM PROCEDURE TRAY) ×2 IMPLANT
PAD ARMBOARD 7.5X6 YLW CONV (MISCELLANEOUS) ×4 IMPLANT
PIN GUIDE DRILL TIP 2.8X450HIP (PIN) IMPLANT
REAMER ROD DEEP FLUTE 2.5X950 (INSTRUMENTS) IMPLANT
SCREW CANN 8.0X95 HIP (Screw) IMPLANT
SCREW HIP F/IM NL 6.5/L85/XL40 (Screw) IMPLANT
SCREW HIP IM  NAIL 6.5X90 (Screw) ×2 IMPLANT
SCREW HIP IM NAIL 6.5X90 (Screw) IMPLANT
SCREW LOCK IM TI 5X40 STRL (Screw) IMPLANT
SCREW LOCK IM TI 5X42 STRL (Screw) IMPLANT
SPONGE T-LAP 18X18 ~~LOC~~+RFID (SPONGE) IMPLANT
STAPLER VISISTAT 35W (STAPLE) ×2 IMPLANT
STOCKINETTE IMPERVIOUS LG (DRAPES) IMPLANT
SUCTION FRAZIER HANDLE 10FR (MISCELLANEOUS) ×2
SUCTION TUBE FRAZIER 10FR DISP (MISCELLANEOUS) ×2 IMPLANT
SUT ETHILON 2 0 FS 18 (SUTURE) ×2 IMPLANT
SUT VIC AB 0 CT1 27 (SUTURE) ×2
SUT VIC AB 0 CT1 27XBRD ANBCTR (SUTURE) ×4 IMPLANT
SUT VIC AB 1 CT1 18XCR BRD 8 (SUTURE) ×4 IMPLANT
SUT VIC AB 1 CT1 27 (SUTURE) ×4
SUT VIC AB 1 CT1 27XBRD ANBCTR (SUTURE) ×2 IMPLANT
SUT VIC AB 1 CT1 8-18 (SUTURE)
SUT VIC AB 2-0 CT1 27 (SUTURE) ×4
SUT VIC AB 2-0 CT1 TAPERPNT 27 (SUTURE) ×4 IMPLANT
TOWEL GREEN STERILE (TOWEL DISPOSABLE) ×4 IMPLANT
TOWEL GREEN STERILE FF (TOWEL DISPOSABLE) ×2 IMPLANT
TRAY FOLEY MTR SLVR 16FR STAT (SET/KITS/TRAYS/PACK) IMPLANT
WATER STERILE IRR 1000ML POUR (IV SOLUTION) ×8 IMPLANT

## 2022-04-02 NOTE — Progress Notes (Signed)
Orthopedic Tech Progress Note Patient Details:  Michele Hunt May 12, 1996 [id number]  Musculoskeletal Traction Type of Traction: Bucks Skin Traction Traction Location: LLE Traction Weight: 15 lbs   Post Interventions Patient Tolerated: Luretha Murphy A Kota Ciancio 04/02/2022, 9:54 AM

## 2022-04-02 NOTE — Plan of Care (Signed)
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
## 2022-04-02 NOTE — ED Notes (Signed)
Changed pt to hospital bed and ortho tech applying bucks traction. Ortho PA at bedside

## 2022-04-02 NOTE — Transfer of Care (Signed)
Immediate Anesthesia Transfer of Care Note  Patient: Michele Hunt  Procedure(s) Performed: OPEN REDUCTION INTERNAL FIXATION (ORIF) PELVIC FRACTURE (Left: Hip) INTRAMEDULLARY (IM) NAIL FEMORAL (Left: Leg Upper)  Patient Location: PACU  Anesthesia Type:General  Level of Consciousness: awake, alert  and oriented  Airway & Oxygen Therapy: Patient Spontanous Breathing  Post-op Assessment: Report given to RN and Post -op Vital signs reviewed and stable  Post vital signs: Reviewed and stable  Last Vitals:  Vitals Value Taken Time  BP 144/95 04/02/22 1611  Temp 37.2 C 04/02/22 1610  Pulse 104 04/02/22 1614  Resp 16 04/02/22 1614  SpO2 99 % 04/02/22 1614  Vitals shown include unvalidated device data.  Last Pain:  Vitals:   04/02/22 1132  TempSrc: Oral  PainSc: 10-Worst pain ever         Complications: No notable events documented.

## 2022-04-02 NOTE — Progress Notes (Signed)
Orthopedic Tech Progress Note Patient Details:  Michele Hunt 13-Jul-1995 [id number]  Patient ID: Michele Hunt, female   DOB: [birth year], 26 y.o.   MRN: [id number] Level II; not currently needed. Michele Hunt 04/02/2022, 1:15 AM

## 2022-04-02 NOTE — Assessment & Plan Note (Signed)
-  We will replace potassium and check magnesium level. 
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
## 2022-04-02 NOTE — Assessment & Plan Note (Signed)
-   She will be monitored for alcohol withdrawal. - We will place on as needed IV Ativan.

## 2022-04-02 NOTE — Anesthesia Preprocedure Evaluation (Addendum)
Anesthesia Evaluation  Patient identified by MRN, date of birth, ID band Patient awake    Reviewed: Allergy & Precautions, H&P , NPO status , Patient's Chart, lab work & pertinent test results  History of Anesthesia Complications Negative for: history of anesthetic complications  Airway Mallampati: II   Neck ROM: full    Dental   Pulmonary Current Smoker and Patient abstained from smoking.,    breath sounds clear to auscultation       Cardiovascular negative cardio ROS   Rhythm:regular Rate:Normal     Neuro/Psych PSYCHIATRIC DISORDERS Anxiety negative neurological ROS     GI/Hepatic (+)     substance abuse  ,  States she drinks ~2 or so beers every other day   IBS    Endo/Other   K 3.1   Renal/GU negative Renal ROS     Musculoskeletal negative musculoskeletal ROS (+) narcotic dependent  Abdominal   Peds  Hematology negative hematology ROS (+)   Anesthesia Other Findings   Reproductive/Obstetrics                           Anesthesia Physical Anesthesia Plan  ASA: 2  Anesthesia Plan: General   Post-op Pain Management: Tylenol PO (pre-op)*, Toradol IV (intra-op)* and Ketamine IV*   Induction: Intravenous  PONV Risk Score and Plan: 3 and Treatment may vary due to age or medical condition, Ondansetron, Dexamethasone and Midazolam  Airway Management Planned: Oral ETT  Additional Equipment: None  Intra-op Plan:   Post-operative Plan: Extubation in OR  Informed Consent:   Plan Discussed with: CRNA and Anesthesiologist  Anesthesia Plan Comments:         Anesthesia Quick Evaluation

## 2022-04-02 NOTE — Assessment & Plan Note (Signed)
-   The patient has subsequent left comminuted femoral diaphysis displaced fracture as well as pelvic displaced fracture involving both left inferior and superior pubic rami and left sacral ala extending through multiple sacral neural foramina. - Pain management and surgical management per Dr. Mable Fill.

## 2022-04-02 NOTE — Anesthesia Procedure Notes (Signed)
Procedure Name: Intubation Date/Time: 04/02/2022 1:15 PM  Performed by: Griffin Dakin, CRNAPre-anesthesia Checklist: Patient identified, Emergency Drugs available, Suction available and Patient being monitored Patient Re-evaluated:Patient Re-evaluated prior to induction Oxygen Delivery Method: Circle system utilized Preoxygenation: Pre-oxygenation with 100% oxygen Induction Type: IV induction Ventilation: Mask ventilation without difficulty Laryngoscope Size: Mac and 4 Grade View: Grade I Tube type: Oral Tube size: 7.0 mm Number of attempts: 1 Airway Equipment and Method: Stylet Placement Confirmation: ETT inserted through vocal cords under direct vision, positive ETCO2 and breath sounds checked- equal and bilateral Secured at: 21 cm Tube secured with: Tape Dental Injury: Teeth and Oropharynx as per pre-operative assessment

## 2022-04-02 NOTE — ED Notes (Signed)
Pt returned from CT °
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
## 2022-04-02 NOTE — ED Notes (Signed)
Patient transported to CT 

## 2022-04-02 NOTE — ED Notes (Signed)
Obtained consent for procedure 
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
## 2022-04-02 NOTE — Anesthesia Postprocedure Evaluation (Signed)
Anesthesia Post Note  Patient: Michele Hunt  Procedure(s) Performed: OPEN REDUCTION INTERNAL FIXATION (ORIF) PELVIC FRACTURE (Left: Hip) INTRAMEDULLARY (IM) NAIL FEMORAL (Left: Leg Upper)     Patient location during evaluation: PACU Anesthesia Type: General Level of consciousness: awake and alert Pain management: pain level controlled Vital Signs Assessment: post-procedure vital signs reviewed and stable Respiratory status: spontaneous breathing, nonlabored ventilation, respiratory function stable and patient connected to nasal cannula oxygen Cardiovascular status: blood pressure returned to baseline and stable Postop Assessment: no apparent nausea or vomiting Anesthetic complications: no   No notable events documented.  Last Vitals:  Vitals:   04/02/22 1640 04/02/22 1655  BP: (!) 131/92 (!) 131/92  Pulse: 100 85  Resp: 14 15  Temp:    SpO2: 97% 100%    Last Pain:  Vitals:   04/02/22 1640  TempSrc:   PainSc: [place]
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
## 2022-04-02 NOTE — ED Notes (Signed)
Gave report to OR. Will transport pt. All belongings given to family

## 2022-04-03 DIAGNOSIS — R102 Pelvic and perineal pain: Secondary | ICD-10-CM

## 2022-04-03 LAB — BASIC METABOLIC PANEL
Anion gap: 4 — ABNORMAL LOW (ref 5–15)
BUN: 7 mg/dL (ref 6–20)
CO2: 25 mmol/L (ref 22–32)
Calcium: 7.8 mg/dL — ABNORMAL LOW (ref 8.9–10.3)
Chloride: 105 mmol/L (ref 98–111)
Creatinine, Ser: 0.73 mg/dL (ref 0.44–1.00)
GFR, Estimated: >60 mL/min (ref >60)
Glucose, Bld: 119 mg/dL — ABNORMAL HIGH (ref 70–99)
Potassium: 3.7 mmol/L (ref 3.5–5.1)
Sodium: 134 mmol/L — ABNORMAL LOW (ref 135–145)

## 2022-04-03 LAB — RAPID URINE DRUG SCREEN, HOSP PERFORMED
Amphetamines: NOT DETECTED
Barbiturates: NOT DETECTED
Benzodiazepines: NOT DETECTED
Cocaine: NOT DETECTED
Opiates: POSITIVE — AB
Tetrahydrocannabinol: NOT DETECTED

## 2022-04-03 LAB — VITAMIN D 25 HYDROXY (VIT D DEFICIENCY, FRACTURES): Vit D, 25-Hydroxy: 14.45 ng/mL — ABNORMAL LOW (ref 30–100)

## 2022-04-03 LAB — CBC
HCT: 29.3 % — ABNORMAL LOW (ref 36.0–46.0)
Hemoglobin: 10 g/dL — ABNORMAL LOW (ref 12.0–15.0)
MCH: 33 pg (ref 26.0–34.0)
MCHC: 34.1 g/dL (ref 30.0–36.0)
MCV: 96.7 fL (ref 80.0–100.0)
Platelets: 97 10*3/uL — ABNORMAL LOW (ref 150–400)
RBC: 3.03 MIL/uL — ABNORMAL LOW (ref 3.87–5.11)
RDW: 12.4 % (ref 11.5–15.5)
WBC: 6.7 10*3/uL (ref 4.0–10.5)
nRBC: 0 % (ref 0.0–0.2)

## 2022-04-03 MED ORDER — ALBUTEROL SULFATE HFA 108 (90 BASE) MCG/ACT IN AERS: 1.0000 | INHALATION_SPRAY | Freq: Four times a day (QID) | RESPIRATORY_TRACT | Status: DC | PRN

## 2022-04-03 MED ORDER — HYDROMORPHONE HCL 2 MG PO TABS
4.0000 mg | ORAL_TABLET | ORAL | Status: DC | PRN
  Administered 2022-04-03 – 2022-04-04 (×5): 4 mg via ORAL
  Administered 2022-04-05 – 2022-04-06 (×5): 6 mg via ORAL
  Filled 2022-04-03: qty 3
  Filled 2022-04-03 (×2): qty 2
  Filled 2022-04-03: qty 3
  Filled 2022-04-03: qty 2
  Filled 2022-04-03 (×3): qty 3
  Filled 2022-04-03 (×2): qty 2

## 2022-04-03 MED ORDER — METHOCARBAMOL 500 MG PO TABS
1000.0000 mg | ORAL_TABLET | Freq: Three times a day (TID) | ORAL | Status: DC
  Administered 2022-04-03 – 2022-04-06 (×9): 1000 mg via ORAL
  Filled 2022-04-03 (×9): qty 2

## 2022-04-03 MED ORDER — ALBUTEROL SULFATE (2.5 MG/3ML) 0.083% IN NEBU: 2.5000 mg | INHALATION_SOLUTION | Freq: Four times a day (QID) | RESPIRATORY_TRACT | Status: DC | PRN

## 2022-04-03 MED ORDER — KETOROLAC TROMETHAMINE 15 MG/ML IJ SOLN
15.0000 mg | Freq: Four times a day (QID) | INTRAMUSCULAR | Status: DC
  Administered 2022-04-03 – 2022-04-06 (×13): 15 mg via INTRAVENOUS
  Filled 2022-04-03 (×13): qty 1

## 2022-04-03 MED ORDER — PREGABALIN 100 MG PO CAPS
100.0000 mg | ORAL_CAPSULE | Freq: Three times a day (TID) | ORAL | Status: DC
  Administered 2022-04-03 – 2022-04-06 (×9): 100 mg via ORAL
  Filled 2022-04-03 (×9): qty 1
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
## 2022-04-03 NOTE — Progress Notes (Signed)
Inpatient Rehab Admissions Coordinator:   Per therapy recommendation, 2 patient was screened for CIR candidacy by Clemens Catholic, MS, CCC-SLP . At this time, Pt. Appears to be a a potential candidate for CIR. I will place   order for rehab consult per protocol for full assessment. Please contact me any with questions.  Clemens Catholic, [place], [place] Admissions Coordinator  [phone] ([place]) [phone] (office)
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
## 2022-04-03 NOTE — Progress Notes (Signed)
Mobility Specialist Progress Note   04/03/22 1450  Mobility  Activity Transferred to/from [facility]  Level of Assistance Other (Comment) (+3 Assist)  Assistive Device Front wheel walker  LLE Weight Bearing NWB  Activity Response Tolerated fair  $Mobility charge 1 Mobility   Received pt on [facility] requesting to get back to bed. Limited by pain during transfer but able to maintain WB precautions. +3A for safety/equipment and physical assist, modA throughout. 3rd person used to move bed closer to pt d/t to the inability to hop or pivot on RLE. Assisted back in bed w/ LLE requiring HHA entire time until back supine. Left w/ call bell in reach and pt's pain settling.   Holland Falling Mobility Specialist MS [facility] #:  [phone] Acute Rehab Office:  [phone]

## 2022-04-04 LAB — BASIC METABOLIC PANEL
Anion gap: 5 (ref 5–15)
BUN: 5 mg/dL — ABNORMAL LOW (ref 6–20)
CO2: 25 mmol/L (ref 22–32)
Calcium: 8 mg/dL — ABNORMAL LOW (ref 8.9–10.3)
Chloride: 107 mmol/L (ref 98–111)
Creatinine, Ser: 0.65 mg/dL (ref 0.44–1.00)
GFR, Estimated: >60 mL/min (ref >60)
Glucose, Bld: 110 mg/dL — ABNORMAL HIGH (ref 70–99)
Potassium: 3.4 mmol/L — ABNORMAL LOW (ref 3.5–5.1)
Sodium: 137 mmol/L (ref 135–145)

## 2022-04-04 LAB — CBC
HCT: 23.4 % — ABNORMAL LOW (ref 36.0–46.0)
Hemoglobin: 8.1 g/dL — ABNORMAL LOW (ref 12.0–15.0)
MCH: 33.2 pg (ref 26.0–34.0)
MCHC: 34.6 g/dL (ref 30.0–36.0)
MCV: 95.9 fL (ref 80.0–100.0)
Platelets: 112 10*3/uL — ABNORMAL LOW (ref 150–400)
RBC: 2.44 MIL/uL — ABNORMAL LOW (ref 3.87–5.11)
RDW: 12.2 % (ref 11.5–15.5)
WBC: 6.3 10*3/uL (ref 4.0–10.5)
nRBC: 0 % (ref 0.0–0.2)
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
## 2022-04-04 NOTE — Progress Notes (Signed)
Inpatient Rehab Admissions Coordinator:    I met with pt. And husband regarding potential CIR admit. She is interested potentially but wants to see how she progresses over the weekend and decide if she'd rather just go home.Self pay estimate was provided to pt.   Clemens Catholic, [place], [place] Admissions Coordinator  [phone] ([place]) [phone] (office)
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
## 2022-04-04 NOTE — Progress Notes (Signed)
Orthopedic Tech Progress Note Patient Details:  Michele Hunt 07-10-1995 [id number]  RN called requesting an OHF for pt. Due to pt complaints of back and abdomen pain, they do not qualify for an OHF. The qualifications include, must be under 28 years old, under 250 lbs, and no upper extremity or spinal issues or deformities. Will call and update RN.  Patient ID: Michele Hunt, female   DOB: [birth year], 26 y.o.   MRN: [id number]  Arville Go 04/04/2022, 12:58 PM

## 2022-04-05 LAB — CBC
HCT: 23.9 % — ABNORMAL LOW (ref 36.0–46.0)
Hemoglobin: 8.3 g/dL — ABNORMAL LOW (ref 12.0–15.0)
MCH: 33.5 pg (ref 26.0–34.0)
MCHC: 34.7 g/dL (ref 30.0–36.0)
MCV: 96.4 fL (ref 80.0–100.0)
Platelets: 145 10*3/uL — ABNORMAL LOW (ref 150–400)
RBC: 2.48 MIL/uL — ABNORMAL LOW (ref 3.87–5.11)
RDW: 12.1 % (ref 11.5–15.5)
WBC: 6 10*3/uL (ref 4.0–10.5)
nRBC: 0 % (ref 0.0–0.2)
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
## 2022-04-05 NOTE — Progress Notes (Signed)
Orthopaedic Trauma Progress Note  SUBJECTIVE: Doing better in terms of pain today.  Was able to get a shower yesterday which she was happy about.  Mobilized out of bed to bedside chair with therapies this morning.  Still deciding if she would like to pursue CIR.  Since yesterday, patient is noted some areas of blistering along her left inner thigh and left calf.  Areas are nontender and not itchy.  She is unsure where they came from.  OBJECTIVE:  Vitals:   04/05/22 0311 04/05/22 0947  BP: 109/73 114/70  Pulse: 93 88  Resp: 18 17  Temp: 98.8 F (37.1 C) 98.4 F (36.9 C)  SpO2: 99% 100%    General: Sitting up in bedside chair, NAD Respiratory: No increased work of breathing.  LLE: Dressings clean, dry, intact mildly.  Three small blistered areas along the inner portion of the leg.  Nontender with palpation over these areas. Tender throughout the thigh as expected.  Tolerates gentle knee range of motion although endorses some discomfort with this.  Ankle DF/PF intact.  Compartment soft compressible.  Neurovascularly intact. RLE: Dressing clean, dry, intact.  Tolerates gentle hip and knee range of motion although slightly uncomfortable.  Ankle DF/PF intact.  Endorses sensation throughout extremity.  IMAGING: Stable post op imaging.   LABS:  Results for orders placed or performed during the hospital encounter of 04/02/22 (from the past 24 hour(s))  CBC     Status: Abnormal   Collection Time: 04/05/22 12:50 PM  Result Value Ref Range   WBC 6.0 4.0 - 10.5 K/uL   RBC 2.48 (L) 3.87 - 5.11 MIL/uL   Hemoglobin 8.3 (L) 12.0 - 15.0 g/dL   HCT 23.9 (L) 36.0 - 46.0 %   MCV 96.4 80.0 - 100.0 fL   MCH 33.5 26.0 - 34.0 pg   MCHC 34.7 30.0 - 36.0 g/dL   RDW 12.1 11.5 - 15.5 %   Platelets 145 (L) 150 - 400 K/uL   nRBC 0.0 0.0 - 0.2 %    ASSESSMENT: ELIZZIE WESTERGARD is a 26 y.o. female, 3 Days Post-Op s/p OPEN REDUCTION INTERNAL FIXATION (ORIF) PELVIC FRACTURE INTRAMEDULLARY NAIL LEFT  FEMUR  CV/Blood loss: Acute blood loss anemia, Hgb 8.1 this 04/04/2022.  Trending downward.  Recheck CBC today. Hemodynamically stable  PLAN: Weightbearing: NWB LLE ROM: Unrestricted hip and knee ROM  Incisional and dressing care: Change PRN Showering: Ok to shower/get incisions wet Orthopedic device(s): None  Pain management: Continue current regimen VTE prophylaxis: Lovenox, SCDs ID:  Ancef 2gm post op completed Foley/Lines:  No foley, KVO IVFs Impediments to Fracture Healing: Vit D 14, continue supplementation Dispo: Therapy evaluation ongoing.  PT/OT currently recommending CIR.  Patient unsure if she would like to pursue this.  We will see how she does today with therapies and pain control and reassess disposition tomorrow.  Will likely d/c on Xarelto vs Eliquis for DVT prophylaxis. Will need to continue Vit D supplementation at d/c  In terms of the areas of blistering along the left inner thigh, l suspect this is irritation from either clothing or bedding.  We will continue to monitor to make sure no new areas appear.  Follow - up plan: 2 weeks after d/c with Dr. Mitchell Heir information:  Katha Hamming MD, Rushie Nyhan PA-C. After hours and holidays please check [email or web address] for group call information for [facility]   Gwinda Passe, PA-C [phone] (office) [email or web address]
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
## 2022-04-06 ENCOUNTER — Inpatient Hospital Stay (HOSPITAL_COMMUNITY)
Admission: RE | Admit: 2022-04-06 | Discharge: 2022-04-15 | DRG: 560 | Disposition: A | Discharge status: Home / Self Care | Payer: No Typology Code available for payment source | Source: Intra-hospital | Attending: Physical Medicine & Rehabilitation | Admitting: Physical Medicine & Rehabilitation

## 2022-04-06 ENCOUNTER — Inpatient Hospital Stay (HOSPITAL_COMMUNITY): Payer: No Typology Code available for payment source

## 2022-04-06 ENCOUNTER — Encounter (HOSPITAL_COMMUNITY): Payer: Self-pay | Admitting: Orthopedic Surgery

## 2022-04-06 ENCOUNTER — Other Ambulatory Visit: Payer: Self-pay

## 2022-04-06 DIAGNOSIS — D62 Acute posthemorrhagic anemia: Secondary | ICD-10-CM | POA: Diagnosis present

## 2022-04-06 DIAGNOSIS — S32592D Other specified fracture of left pubis, subsequent encounter for fracture with routine healing: Secondary | ICD-10-CM | POA: Diagnosis not present

## 2022-04-06 DIAGNOSIS — F4320 Adjustment disorder, unspecified: Secondary | ICD-10-CM | POA: Diagnosis not present

## 2022-04-06 DIAGNOSIS — R630 Anorexia: Secondary | ICD-10-CM | POA: Diagnosis not present

## 2022-04-06 DIAGNOSIS — Z807 Family history of other malignant neoplasms of lymphoid, hematopoietic and related tissues: Secondary | ICD-10-CM | POA: Diagnosis not present

## 2022-04-06 DIAGNOSIS — Z881 Allergy status to other antibiotic agents status: Secondary | ICD-10-CM

## 2022-04-06 DIAGNOSIS — E876 Hypokalemia: Secondary | ICD-10-CM | POA: Diagnosis present

## 2022-04-06 DIAGNOSIS — Z87410 Personal history of cervical dysplasia: Secondary | ICD-10-CM | POA: Diagnosis not present

## 2022-04-06 DIAGNOSIS — T07XXXA Unspecified multiple injuries, initial encounter: Secondary | ICD-10-CM | POA: Diagnosis present

## 2022-04-06 DIAGNOSIS — M7989 Other specified soft tissue disorders: Secondary | ICD-10-CM

## 2022-04-06 DIAGNOSIS — R000 Tachycardia, unspecified: Secondary | ICD-10-CM | POA: Diagnosis not present

## 2022-04-06 DIAGNOSIS — S7222XD Displaced subtrochanteric fracture of left femur, subsequent encounter for closed fracture with routine healing: Principal | ICD-10-CM

## 2022-04-06 DIAGNOSIS — K5900 Constipation, unspecified: Secondary | ICD-10-CM | POA: Diagnosis present

## 2022-04-06 DIAGNOSIS — D696 Thrombocytopenia, unspecified: Secondary | ICD-10-CM | POA: Diagnosis present

## 2022-04-06 DIAGNOSIS — Z8249 Family history of ischemic heart disease and other diseases of the circulatory system: Secondary | ICD-10-CM

## 2022-04-06 DIAGNOSIS — E559 Vitamin D deficiency, unspecified: Secondary | ICD-10-CM | POA: Diagnosis present

## 2022-04-06 DIAGNOSIS — S32129D Unspecified Zone II fracture of sacrum, subsequent encounter for fracture with routine healing: Secondary | ICD-10-CM | POA: Diagnosis not present

## 2022-04-06 DIAGNOSIS — F32A Depression, unspecified: Secondary | ICD-10-CM | POA: Diagnosis not present

## 2022-04-06 DIAGNOSIS — F17210 Nicotine dependence, cigarettes, uncomplicated: Secondary | ICD-10-CM | POA: Diagnosis present

## 2022-04-06 DIAGNOSIS — R509 Fever, unspecified: Secondary | ICD-10-CM | POA: Diagnosis present

## 2022-04-06 DIAGNOSIS — R609 Edema, unspecified: Secondary | ICD-10-CM

## 2022-04-06 DIAGNOSIS — R102 Pelvic and perineal pain: Secondary | ICD-10-CM | POA: Diagnosis not present

## 2022-04-06 DIAGNOSIS — K5903 Drug induced constipation: Secondary | ICD-10-CM | POA: Diagnosis not present

## 2022-04-06 DIAGNOSIS — R300 Dysuria: Secondary | ICD-10-CM | POA: Diagnosis present

## 2022-04-06 DIAGNOSIS — S7221XA Displaced subtrochanteric fracture of right femur, initial encounter for closed fracture: Secondary | ICD-10-CM

## 2022-04-06 LAB — URINALYSIS, ROUTINE W REFLEX MICROSCOPIC
Bilirubin Urine: NEGATIVE
Glucose, UA: NEGATIVE mg/dL
Hgb urine dipstick: NEGATIVE
Ketones, ur: NEGATIVE mg/dL
Leukocytes,Ua: NEGATIVE
Nitrite: NEGATIVE
Protein, ur: NEGATIVE mg/dL
Specific Gravity, Urine: 1.009 (ref 1.005–1.030)
pH: 7 (ref 5.0–8.0)

## 2022-04-06 MED ORDER — FOLIC ACID 1 MG PO TABS
1.0000 mg | ORAL_TABLET | Freq: Every day | ORAL | Status: DC
  Administered 2022-04-07 – 2022-04-15 (×9): 1 mg via ORAL
  Filled 2022-04-06 (×9): qty 1

## 2022-04-06 MED ORDER — PREGABALIN 50 MG PO CAPS
100.0000 mg | ORAL_CAPSULE | Freq: Three times a day (TID) | ORAL | Status: DC
  Administered 2022-04-06 – 2022-04-15 (×27): 100 mg via ORAL
  Filled 2022-04-06 (×27): qty 2

## 2022-04-06 MED ORDER — ENOXAPARIN SODIUM 40 MG/0.4ML IJ SOSY: 40.0000 mg | PREFILLED_SYRINGE | INTRAMUSCULAR | Status: DC

## 2022-04-06 MED ORDER — ADULT MULTIVITAMIN W/MINERALS CH
1.0000 | ORAL_TABLET | Freq: Every day | ORAL | Status: DC
  Administered 2022-04-07 – 2022-04-15 (×9): 1 via ORAL
  Filled 2022-04-06 (×9): qty 1

## 2022-04-06 MED ORDER — PROCHLORPERAZINE 25 MG RE SUPP: 12.5000 mg | Freq: Four times a day (QID) | RECTAL | Status: DC | PRN

## 2022-04-06 MED ORDER — ENOXAPARIN SODIUM 40 MG/0.4ML IJ SOSY
40.0000 mg | PREFILLED_SYRINGE | INTRAMUSCULAR | Status: DC
  Administered 2022-04-07 – 2022-04-08 (×2): 40 mg via SUBCUTANEOUS
  Filled 2022-04-06 (×2): qty 0.4

## 2022-04-06 MED ORDER — GUAIFENESIN-DM 100-10 MG/5ML PO SYRP: 5.0000 mL | ORAL_SOLUTION | Freq: Four times a day (QID) | ORAL | Status: DC | PRN

## 2022-04-06 MED ORDER — PROCHLORPERAZINE MALEATE 5 MG PO TABS: 5.0000 mg | ORAL_TABLET | Freq: Four times a day (QID) | ORAL | Status: DC | PRN

## 2022-04-06 MED ORDER — HYDROMORPHONE HCL 1 MG/ML IJ SOLN
0.5000 mg | Freq: Three times a day (TID) | INTRAMUSCULAR | Status: DC | PRN
  Administered 2022-04-06: 1 mg via INTRAVENOUS
  Filled 2022-04-06: qty 1

## 2022-04-06 MED ORDER — SORBITOL 70 % SOLN: 30.0000 mL | Freq: Every day | Status: DC | PRN

## 2022-04-06 MED ORDER — HYDROMORPHONE HCL 2 MG PO TABS
4.0000 mg | ORAL_TABLET | ORAL | Status: DC | PRN
  Administered 2022-04-06 – 2022-04-14 (×43): 6 mg via ORAL
  Filled 2022-04-06 (×7): qty 3
  Filled 2022-04-06: qty 2
  Filled 2022-04-06 (×14): qty 3
  Filled 2022-04-06: qty 2
  Filled 2022-04-06 (×21): qty 3

## 2022-04-06 MED ORDER — ACETAMINOPHEN 325 MG PO TABS
325.0000 mg | ORAL_TABLET | ORAL | Status: DC | PRN
  Administered 2022-04-06 – 2022-04-14 (×8): 650 mg via ORAL
  Filled 2022-04-06 (×9): qty 2

## 2022-04-06 MED ORDER — SENNOSIDES-DOCUSATE SODIUM 8.6-50 MG PO TABS
1.0000 | ORAL_TABLET | Freq: Two times a day (BID) | ORAL | Status: DC
  Administered 2022-04-07 – 2022-04-15 (×15): 1 via ORAL
  Filled 2022-04-06 (×16): qty 1

## 2022-04-06 MED ORDER — BISACODYL 10 MG RE SUPP
10.0000 mg | Freq: Every day | RECTAL | Status: DC
  Administered 2022-04-09 – 2022-04-14 (×3): 10 mg via RECTAL
  Filled 2022-04-06 (×4): qty 1

## 2022-04-06 MED ORDER — MAGNESIUM HYDROXIDE 400 MG/5ML PO SUSP: 30.0000 mL | Freq: Every day | ORAL | Status: DC | PRN

## 2022-04-06 MED ORDER — SORBITOL 70 % SOLN
60.0000 mL | Freq: Once | Status: AC
  Administered 2022-04-06: 60 mL via ORAL
  Filled 2022-04-06: qty 60

## 2022-04-06 MED ORDER — TRAZODONE HCL 50 MG PO TABS
25.0000 mg | ORAL_TABLET | Freq: Every evening | ORAL | Status: DC | PRN
  Administered 2022-04-07 – 2022-04-08 (×2): 50 mg via ORAL
  Filled 2022-04-06 (×2): qty 1

## 2022-04-06 MED ORDER — DIPHENHYDRAMINE HCL 12.5 MG/5ML PO ELIX: 12.5000 mg | ORAL_SOLUTION | Freq: Four times a day (QID) | ORAL | Status: DC | PRN

## 2022-04-06 MED ORDER — PHENAZOPYRIDINE HCL 100 MG PO TABS
100.0000 mg | ORAL_TABLET | Freq: Three times a day (TID) | ORAL | Status: DC
  Administered 2022-04-06: 100 mg via ORAL
  Filled 2022-04-06 (×2): qty 1

## 2022-04-06 MED ORDER — METHOCARBAMOL 500 MG PO TABS
1000.0000 mg | ORAL_TABLET | Freq: Three times a day (TID) | ORAL | Status: DC
  Administered 2022-04-06 – 2022-04-15 (×27): 1000 mg via ORAL
  Filled 2022-04-06 (×27): qty 2

## 2022-04-06 MED ORDER — PROCHLORPERAZINE EDISYLATE 10 MG/2ML IJ SOLN: 5.0000 mg | Freq: Four times a day (QID) | INTRAMUSCULAR | Status: DC | PRN

## 2022-04-06 MED ORDER — PHENAZOPYRIDINE HCL 100 MG PO TABS
100.0000 mg | ORAL_TABLET | Freq: Three times a day (TID) | ORAL | Status: DC
  Filled 2022-04-06: qty 1

## 2022-04-06 MED ORDER — ALUM & MAG HYDROXIDE-SIMETH 200-200-20 MG/5ML PO SUSP: 30.0000 mL | ORAL | Status: DC | PRN

## 2022-04-06 MED ORDER — ALBUTEROL SULFATE (2.5 MG/3ML) 0.083% IN NEBU: 2.5000 mg | INHALATION_SOLUTION | Freq: Four times a day (QID) | RESPIRATORY_TRACT | Status: DC | PRN

## 2022-04-06 MED ORDER — SENNOSIDES-DOCUSATE SODIUM 8.6-50 MG PO TABS
1.0000 | ORAL_TABLET | Freq: Two times a day (BID) | ORAL | Status: DC
  Administered 2022-04-06: 1 via ORAL
  Filled 2022-04-06: qty 1

## 2022-04-06 NOTE — Progress Notes (Signed)
Lower extremity venous has been completed.   Preliminary results in CV Proc.   Jinny Blossom Emersyn Wyss 04/06/2022 2:38 PM

## 2022-04-06 NOTE — Progress Notes (Signed)
Admitted to CIR today. Oriented to unit and assigned room. Admission/Assessment complete.    Yehuda Mao, LPN

## 2022-04-06 NOTE — Progress Notes (Signed)
Patient refused scheduled dulcolax. Patient understands the purpose of medication. Patient had a large bowel movement this shift.    Yehuda Mao, LPN
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
## 2022-04-06 NOTE — Progress Notes (Signed)
Inpatient Rehab Admissions Coordinator:    I have  CIR bed and will admit today. RN may call report to [place], [place], [place] Admissions Coordinator  [phone] ([place]) [phone] (office)
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
## 2022-04-06 NOTE — TOC CAGE-AID Note (Signed)
Transition of Care [facility]) - CAGE-AID Screening   Patient Details  Name: Michele Hunt MRN: [id number] Date of Birth: 04-30-1996  Transition of [facility]) CM/SW Contact:    Coralee Pesa, [place] Phone Number: 04/06/2022, 4:04 PM   Clinical Narrative: CSW met with pt to complete CAGE- AID, pt denies any substance use, declines resources at this time.   CAGE-AID Screening:    Have You Ever Felt You Ought to Cut Down on Your Drinking or Drug Use?: No Have People Annoyed You By Critizing Your Drinking Or Drug Use?: No Have You Felt Bad Or Guilty About Your Drinking Or Drug Use?: No Have You Ever Had a Drink or Used Drugs First Thing In The Morning to Steady Your Nerves or to Get Rid of a Hangover?: No CAGE-AID Score: 0  Substance Abuse Education Offered: No

## 2022-04-06 NOTE — Progress Notes (Signed)
Notified PA of results of venous of lower extremity.     Yehuda Mao, LPN

## 2022-04-06 NOTE — Progress Notes (Signed)
Mobility Specialist Progress Note   04/06/22 1205  Mobility  Activity Stood at bedside;Transferred from chair to bed (x5)  Level of Assistance Minimal assist, patient does 75% or more  Assistive Device Front wheel walker  Distance Ambulated (ft) 2 ft  LLE Weight Bearing NWB  Activity Response Tolerated well  $Mobility charge 1 Mobility   Received pt in chair c/o sacrum pain(5/10) but agreeable if allowed pain meds before session. X5 STS w/ x3 bouts of hip flexion when standing. Pt's range of motion progressing w/ each attempt. Requiring min cues for WB precautions as LLE was propped on MS foot. Able to pivot to EOB once finished. Left supine in room w/ PA from inpatient rehab.   Holland Falling Mobility Specialist MS [facility] #:  [phone] Acute Rehab Office:  [phone]

## 2022-04-06 NOTE — Progress Notes (Signed)
Inpatient Rehabilitation Admission Medication Review by a Pharmacist  A complete drug regimen review was completed for this patient to identify any potential clinically significant medication issues.  High Risk Drug Classes Is patient taking? Indication by Medication  Antipsychotic No PRN prochlorperazine - nausea/vomiting  Anticoagulant Yes Enoxaparin - VTE prophylaxis  Antibiotic No   Opioid Yes PRN hydromorphone - moderate to severe pain  Antiplatelet No   Hypoglycemics/insulin No   Vasoactive Medication No   Chemotherapy No   Other No Pregabalin - neuropathic pain Methocarbamol - muscle spasms MVI, thiamine, folate - supplements Bisacodyl and Senokot S scheduled, prn sorbitol or MOM - laxatives PRN acetaminophen - mild pain PRN albuterol nebs - wheezing/ shortness of breath PRN trazodone - sleep PRN Maalox - indigestion PRN guaifenesin-dextromethorphan - cough PRN diphenhydramine - itching     Type of Medication Issue Identified Description of Issue Recommendation(s)  Drug Interaction(s) (clinically significant)     Duplicate Therapy     Allergy     No Medication Administration End Date     Incorrect Dose     Additional Drug Therapy Needed     Significant med changes from prior encounter (inform family/care partners about these prior to discharge). Not taking any medications prior to admission.   Other  Phenazopyridine TID begun this morning and intended x 2 days.  Noted plan for Vitamin D supplement.  Scheduled Acetaminophen and Ketorolac not continued on transfer. Had received 15 of planned 20 doses. Resume Phenazopyridine for another day if clinically indicated. Begin Vitamin D supplement.  Acetaminophen changed to prn on transfer to CIR.   Injectable NSAID not continued on transfer to CIR. Had been planned for ~1 more day. Monitor pain control for any need to add prn oral NSAID.    Clinically significant medication issues were identified that warrant  physician communication and completion of prescribed/recommended actions by midnight of the next day:  No  Pharmacist comments:  Will follow up for Vitamin D supplementation.  Follow up for urinary symptoms, s/p 1 dose of Phenazopyridine this am.  Time spent performing this drug regimen review (minutes):  20   Arty Baumgartner, [place] 04/06/2022 3:56 PM

## 2022-04-07 DIAGNOSIS — E876 Hypokalemia: Secondary | ICD-10-CM

## 2022-04-07 DIAGNOSIS — R000 Tachycardia, unspecified: Secondary | ICD-10-CM

## 2022-04-07 DIAGNOSIS — D62 Acute posthemorrhagic anemia: Secondary | ICD-10-CM

## 2022-04-07 DIAGNOSIS — K5903 Drug induced constipation: Secondary | ICD-10-CM

## 2022-04-07 DIAGNOSIS — T07XXXA Unspecified multiple injuries, initial encounter: Secondary | ICD-10-CM

## 2022-04-07 LAB — COMPREHENSIVE METABOLIC PANEL
ALT: 38 U/L (ref 0–44)
AST: 38 U/L (ref 15–41)
Albumin: 3.1 g/dL — ABNORMAL LOW (ref 3.5–5.0)
Alkaline Phosphatase: 50 U/L (ref 38–126)
Anion gap: 9 (ref 5–15)
BUN: 8 mg/dL (ref 6–20)
CO2: 26 mmol/L (ref 22–32)
Calcium: 8.7 mg/dL — ABNORMAL LOW (ref 8.9–10.3)
Chloride: 102 mmol/L (ref 98–111)
Creatinine, Ser: 0.56 mg/dL (ref 0.44–1.00)
GFR, Estimated: >60 mL/min (ref >60)
Glucose, Bld: 117 mg/dL — ABNORMAL HIGH (ref 70–99)
Potassium: 4.1 mmol/L (ref 3.5–5.1)
Sodium: 137 mmol/L (ref 135–145)
Total Bilirubin: 0.6 mg/dL (ref 0.3–1.2)
Total Protein: 6.1 g/dL — ABNORMAL LOW (ref 6.5–8.1)

## 2022-04-07 LAB — URINE CULTURE: Culture: NO GROWTH

## 2022-04-07 LAB — CBC WITH DIFFERENTIAL/PLATELET
Abs Immature Granulocytes: 0.09 10*3/uL — ABNORMAL HIGH (ref 0.00–0.07)
Basophils Absolute: 0 10*3/uL (ref 0.0–0.1)
Basophils Relative: 0 %
Eosinophils Absolute: 0.1 10*3/uL (ref 0.0–0.5)
Eosinophils Relative: 1 %
HCT: 24.4 % — ABNORMAL LOW (ref 36.0–46.0)
Hemoglobin: 8.3 g/dL — ABNORMAL LOW (ref 12.0–15.0)
Immature Granulocytes: 1 %
Lymphocytes Relative: 12 %
Lymphs Abs: 1 10*3/uL (ref 0.7–4.0)
MCH: 32.8 pg (ref 26.0–34.0)
MCHC: 34 g/dL (ref 30.0–36.0)
MCV: 96.4 fL (ref 80.0–100.0)
Monocytes Absolute: 0.9 10*3/uL (ref 0.1–1.0)
Monocytes Relative: 11 %
Neutro Abs: 6.1 10*3/uL (ref 1.7–7.7)
Neutrophils Relative %: 75 %
Platelets: 203 10*3/uL (ref 150–400)
RBC: 2.53 MIL/uL — ABNORMAL LOW (ref 3.87–5.11)
RDW: 12.3 % (ref 11.5–15.5)
WBC: 8.2 10*3/uL (ref 4.0–10.5)
nRBC: 0 % (ref 0.0–0.2)

## 2022-04-07 LAB — MAGNESIUM: Magnesium: 2 mg/dL (ref 1.7–2.4)

## 2022-04-07 LAB — PHOSPHORUS: Phosphorus: 3.5 mg/dL (ref 2.5–4.6)

## 2022-04-07 LAB — VITAMIN B12: Vitamin B-12: 235 pg/mL (ref 180–914)

## 2022-04-07 LAB — FOLATE: Folate: 9.4 ng/mL (ref >5.9)

## 2022-04-07 MED ORDER — VITAMIN D (ERGOCALCIFEROL) 1.25 MG (50000 UNIT) PO CAPS
50000.0000 [IU] | ORAL_CAPSULE | ORAL | Status: DC
  Administered 2022-04-07 – 2022-04-14 (×2): 50000 [IU] via ORAL
  Filled 2022-04-07 (×2): qty 1

## 2022-04-07 MED ORDER — HYDROMORPHONE HCL 2 MG PO TABS
2.0000 mg | ORAL_TABLET | Freq: Once | ORAL | Status: AC
  Administered 2022-04-07: 2 mg via ORAL
  Filled 2022-04-07: qty 1
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
## 2022-04-07 NOTE — Plan of Care (Signed)
  Problem: Sit to Stand Goal: LTG:  Patient will perform sit to stand with assistance level (PT) Description: LTG:  Patient will perform sit to stand with assistance level (PT) Flowsheets (Taken 04/07/2022 1608) LTG: PT will perform sit to stand in preparation for functional mobility with assistance level: Independent with assistive device   Problem: RH Bed Mobility Goal: LTG Patient will perform bed mobility with assist (PT) Description: LTG: Patient will perform bed mobility with assistance, with/without cues (PT). Flowsheets (Taken 04/07/2022 1608) LTG: Pt will perform bed mobility with assistance level of: Independent with assistive device    Problem: RH Bed to Chair Transfers Goal: LTG Patient will perform bed/chair transfers w/assist (PT) Description: LTG: Patient will perform bed to chair transfers with assistance (PT). Flowsheets (Taken 04/07/2022 1608) LTG: Pt will perform Bed to Chair Transfers with assistance level: Independent with assistive device    Problem: RH Car Transfers Goal: LTG Patient will perform car transfers with assist (PT) Description: LTG: Patient will perform car transfers with assistance (PT). Flowsheets (Taken 04/07/2022 1608) LTG: Pt will perform car transfers with assist:: Independent with assistive device    Problem: RH Ambulation Goal: LTG Patient will ambulate in home environment (PT) Description: LTG: Patient will ambulate in home environment, # of feet with assistance (PT). Flowsheets (Taken 04/07/2022 1608) LTG: Pt will ambulate in home environ  assist needed:: Independent with assistive device LTG: Ambulation distance in home environment: 50'   Problem: RH Wheelchair Mobility Goal: LTG Patient will propel w/c in community environment (PT) Description: LTG: Patient will propel wheelchair in community environment, # of feet with assist (PT) Flowsheets (Taken 04/07/2022 1608) LTG: Pt will propel w/c in community environ  assist needed::  Independent with assistive device Distance: wheelchair distance in controlled environment: 150   Problem: RH Stairs Goal: LTG Patient will ambulate up and down stairs w/assist (PT) Description: LTG: Patient will ambulate up and down # of stairs with assistance (PT) Flowsheets (Taken 04/07/2022 1608) LTG: Pt will ambulate up/down stairs assist needed:: Supervision/Verbal cueing LTG: Pt will  ambulate up and down number of stairs: 4

## 2022-04-07 NOTE — Progress Notes (Signed)
Inpatient Rehabilitation  Patient information reviewed and entered into eRehab system by Connelly [place] M. Leahna Hewson, M.A., CCC/SLP, PPS Coordinator.  Information including medical coding, functional ability and quality indicators will be reviewed and updated through discharge.    
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
## 2022-04-07 NOTE — Progress Notes (Signed)
Occupational Therapy Session Note  Patient Details  Name: Michele Hunt MRN: [id number] Date of Birth: 1996-04-13  Today's Date: 04/07/2022 OT Individual Time: 1100-1140 OT Individual Time Calculation (min): 40 min    Short Term Goals: Week 1:  OT Short Term Goal 1 (Week 1): Pt will complete 1/3 toileting steps with CGA for balance using RW OT Short Term Goal 2 (Week 1): Pt will donn LB clothing with AE PRN with overall min A OT Short Term Goal 3 (Week 1): Pt will sit > stand in prep for ADL with supervision  Skilled Therapeutic Interventions/Progress Updates:  Balance/vestibular training;Discharge planning;Pain management;Self Care/advanced ADL retraining;Therapeutic Activities;UE/LE Coordination activities;Functional mobility training;Patient/family education;Therapeutic Exercise;Skin care/wound managment;DME/adaptive equipment instruction;Psychosocial support;UE/LE Strength taining/ROM;Wheelchair propulsion/positioning   1:1 Pt received in the w/c. Focus on self care retraining at shower level. Pt transferred to tub bench (facing outward) with RW with min A with mod cues to maintain NWB status on left left. Pt required A to wash lower legs and buttocks due to pain; pt  however was able to lift her buttocks to unweight herself to allow therapist to wash her. Pt able to wash the rest of her. Pt donned shirt with setup. Stand pivot to the w/c but with difficulty with pivoting (due to angle of chair) and pain. Threaded pants with total A and A to pull up pants. Pt left sitting at the sink with boyfriend in the room for support. Pt left fixing her hair.   Therapy Documentation Precautions:  Precautions Precautions: Fall Restrictions Weight Bearing Restrictions: Yes LLE Weight Bearing: Non weight bearing Other Position/Activity Restrictions: Per ortho- "Nonweightbearing for L leg due to pelvis and unrestricted range of motion left hip and knee"  Pain: Asked for meds at end of session due to  increased pain in pelvis    Therapy/Group: Individual Therapy  Willeen Cass [facility] 04/07/2022, 2:06 PM

## 2022-04-08 DIAGNOSIS — R300 Dysuria: Secondary | ICD-10-CM

## 2022-04-08 MED ORDER — RIVAROXABAN 10 MG PO TABS
10.0000 mg | ORAL_TABLET | Freq: Every day | ORAL | Status: DC
  Administered 2022-04-09 – 2022-04-14 (×6): 10 mg via ORAL
  Filled 2022-04-08 (×6): qty 1

## 2022-04-08 NOTE — Discharge Instructions (Addendum)
Inpatient Rehab Discharge Instructions  Michele Hunt Discharge date and time:  04/15/25  Activities/Precautions/ Functional Status: Activity: no lifting, driving, or strenuous exercise until cleared by MD. NO WEIGHT ON LEFT LEG UNTIL CLEARED BY DR. HANDY Diet: regular diet Wound Care: Wash with soap and water. Keep wound clean and dry. Contact Dr. Marcelino Scot if you develop any problems with your incision/wound--redness, swelling, increase in pain, drainage or if you develop fever or chills.     Functional status:  ___ No restrictions     ___ Walk up steps independently __X_ 24/7 supervision/assistance   ___ Walk up steps with assistance ___ Intermittent supervision/assistance  ___ Bathe/dress independently ___ Walk with walker     ___ Bathe/dress with assistance ___ Walk Independently    ___ Shower independently ___ Walk with assistance    _X__ Shower with assistance _X__ No alcohol     ___ Return to work/school ________  Special Instructions: No smoking or any tobacco products--this will hinder and delay your bones from healing.    COMMUNITY REFERRALS UPON DISCHARGE:    Home Exercise Program given to patient will need outpatient follow up once can weight bear    Medical Equipment/Items Ordered:wheelchair, 3 in 1 and tub bench has a rolling walker from another family member                                                 Agency/Supplier:[employer]  [phone]  Match placed in system for medication assistance   My questions have been answered and I understand these instructions. I will adhere to these goals and the provided educational materials after my discharge from the hospital.  Patient/Caregiver Signature _______________________________ Date __________  Clinician Signature _______________________________________ Date __________  Please bring this form and your medication list with you to all your follow-up doctor's appointments.
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
## 2022-04-08 NOTE — Progress Notes (Signed)
Physical Therapy Session Note  Patient Details  Name: Michele Hunt MRN: [id number] Date of Birth: 1995-07-30  Today's Date: 04/08/2022 PT Individual Time: 5852-7782 PT Individual Time Calculation (min): 70 min   Short Term Goals: Week 1:  PT Short Term Goal 1 (Week 1): STG = LTG secondary to ELOS  Skilled Therapeutic Interventions/Progress Updates: Pt presented in bed hand off from nsg as pt at [facility]. Pain 8/10 premedicated, rest breaks and repositioning provided throughout session. Pt with continent urinary void, once completed performed Sit to stand from [facility] with CGA and RW and PTA performed peri-care as pt unable to complete due to pain. Performed stand pivot to bed with pt noted to place LLE on ground but unsure if wt placed, PTA placed foot under pt's L foot with mild pressure noted. Performed sit to supine with minA for LLE management. Pt the participated in supine therex to "warm up" and to allow pain meds some time to take affect. Pt participated in ankle pumps, AA heel slides, AA SLR, AA hip abd/add with use of gait belt, and SAQ x 12-15 (to fatigue/pain limit). Pt did require increased time to perform activities as well as rest breaks between activities due to pain. Pt then performed supine to sit at EOB minA for LLE management. PTA encouraged use of gait belt however was unable to complete without physical assist due to pain. At EOB, performed block practice stand pivot transfers between bed and w/c with pt able to perform with CGA to SBA. Pt's SO also present and did perform x 1 stand pivot between bed and w/c. Pt then ambulated ~42f with RW and CGA. Pt was able to maintain NWB although did place L forefoot on ground intermittently although did not appear to place any pressure. Pt did require several rest breaks due to fatigue of BUE. Pt returned to EOB but then requested to transfer to w/c. SO assisted with transfer to w/c (again demonstrating appropriate cues and maintaining NWB). Pt then  handed off to COTA for next session with current needs met.      Therapy Documentation Precautions:  Precautions Precautions: Fall Restrictions Weight Bearing Restrictions: Yes LLE Weight Bearing: Non weight bearing Other Position/Activity Restrictions: Per ortho- "Nonweightbearing for L leg due to pelvis and unrestricted range of motion left hip and knee" General:   Vital Signs: Therapy Vitals Temp: 98.7 F (37.1 C) Temp Source: Oral Pulse Rate: 98 Resp: 15 BP: 94/62 Patient Position (if appropriate): Sitting Oxygen Therapy SpO2: 100 % Pain: Pain Assessment Pain Scale: 0-10 Pain Score: 8  Pain Type: Surgical pain Pain Location: Pelvis Pain Orientation: Posterior Pain Descriptors / Indicators: Aching Pain Frequency: Constant Pain Onset: On-going Pain Intervention(s): Medication (See eMAR) Mobility:   Locomotion :    Trunk/Postural Assessment :    Balance:   Exercises:   Other Treatments:      Therapy/Group: Individual Therapy  Pearse Shiffler 04/08/2022, 3:38 PM

## 2022-04-08 NOTE — Progress Notes (Addendum)
Occupational Therapy Session Note  Patient Details  Name: Michele Hunt MRN: [id number] Date of Birth: 11-27-1995  Today's Date: 04/08/2022 OT Individual Time: 7846-9629 session 1 OT Individual Time Calculation (min): 46 min  Session 2: 5284-1324   Short Term Goals: Week 1:  OT Short Term Goal 1 (Week 1): Pt will complete 1/3 toileting steps with CGA for balance using RW OT Short Term Goal 2 (Week 1): Pt will donn LB clothing with AE PRN with overall min A OT Short Term Goal 3 (Week 1): Pt will sit > stand in prep for ADL with supervision  Skilled Therapeutic Interventions/Progress Updates:  Session 1: Pt greeted seated in w/c, pt handed off from OT. pt agreeable to OT intervention. Session focus on BADL reeducation, functional mobility, dynamic standing balance and decreasing overall caregiver burden.          Pt completed stand pivot transfer into shower with CGA with RW, MIN cues for sequencing but able to maintain NWB with no cues. Pt completed bathing from shower seat with overall MINA, pt would benefit from [facility] [employer] sponge for LB bathing. IV covered however LE bandages left in place per orders. Discussed shower at home being tub shower, verablly described TTB method. Also discussed methods for washing buttocks with pt able to lean laterally to achieve this task. Pt exited shower in same manner. Pt completed dressing from w/c, set- up to don bra and OH shirt. MOD A to don pants with pt needing assist to thread LLE but able to thread RLE, discussed use of reacher next session. Pt able to stand with CGA to pull pants to waist line with + time. pt left seated in w/c with all needs within reach about to roll outside with boyfriend with grounds pass, RN aware.                       Session 2: pt greeted seated in w/c, pt agreeable to OT intervention. Total A transport to ADL apt where pt able to complete stand pivot transfer from w/c>TTB with Rw, pt does require assist to elevate LLE over threshold of  tub, education provided on using family as needed to assist with this task or use gait belt as leg lifter. Pt exited shower in same fashion. Reviewed all LB AE with pt for pain mgmt, and issued pt LH sponge for LB bathing. Pt verbalized understanding of all LB AE with pt able to use sock aid to don R sock with MIN A and MIN verbal cues for novel task.  Remainder of session focus on transfer training with pt completing stand pivot transfers from w/c<>EOM with Rw and CGA. Pt with difficulty keeping LLE fully of the group during transfer but noted to still maintain NWB.  Pt left seated in w/c with all needs within reach.   Therapy Documentation Precautions:  Precautions Precautions: Fall Restrictions Weight Bearing Restrictions: Yes LLE Weight Bearing: Non weight bearing Other Position/Activity Restrictions: Per ortho- "Nonweightbearing for L leg due to pelvis and unrestricted range of motion left hip and knee"  Pain: Session 1: 6/10 pain in LLE, rest breaks provided as needed.  Session 2: unrated pain reported in LLE, rest breaks provided as needed.    Therapy/Group: Individual Therapy  Keionna Kinnaird 04/08/2022, 12:19 PM

## 2022-04-08 NOTE — Progress Notes (Signed)
Physical Therapy Session Note  Patient Details  Name: Michele Hunt MRN: [id number] Date of Birth: 11-24-1995  Today's Date: 04/08/2022 PT Individual Time: 1135-1200 PT Individual Time Calculation (min): 25 min   Short Term Goals: Week 1:  PT Short Term Goal 1 (Week 1): STG = LTG secondary to ELOS  Skilled Therapeutic Interventions/Progress Updates:     Patient in w/c in the room upon PT arrival. Patient alert and agreeable to PT session. Patient reported 8/10 pelvis and L lower extremity pain during session, RN made aware and provided Tylenol during session. PT provided repositioning, rest breaks, and distraction as pain interventions throughout session.   Focused session on pain management education. Encouraged her to change positioning (bed<>chair) throughout the day. Educated on diaphragmatic breathing for reduced nervous system response to pain and for muscle relaxation to reduce spasms, provided tactile cues for coordination for diaphragm activation and use of 4 square breathing technique.   Patient reports sitting up most of the morning and agreed to change positions due to elevated pain level.  Therapeutic Activity: Bed Mobility: Patient performed sit to supine with mod A for B lower extremity management with use of hospital bed features. Patient utilized teach back method for technique.  Transfers: Patient performed stand pivot w/c>bed with CGA-min A using RW and increased time and good control during pivot. Provided verbal cues for shoulder depression to improve UE support during pivot.  Patient in bed with 4 ice pack applied to her pelvis and L thigh with her boyfriend in the room at end of session with breaks locked, bed alarm set, 4 rails up per patient request, and all needs within reach.   Therapy Documentation Precautions:  Precautions Precautions: Fall Restrictions Weight Bearing Restrictions: Yes LLE Weight Bearing: Non weight bearing Other Position/Activity  Restrictions: Per ortho- "Nonweightbearing for L leg due to pelvis and unrestricted range of motion left hip and knee"    Therapy/Group: Individual Therapy  Avereigh Spainhower L Sabrina Keough PT, DPT, NCS, CBIS  04/08/2022, 12:18 PM
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
## 2022-04-08 NOTE — Progress Notes (Signed)
Patient ID: Michele Hunt, female   DOB: 07-17-1995, 26 y.o.   MRN: [id number]  Met with pt and boyfriend to update regarding team conference goals of mod/I level and discharge date of 10/26. Pain is pt's main limiting factor but each day it is getting better. They do have access to walker and crutches. Boyfriend will see if can use the temporary ramp his Mom has at her home. Discussed wheelchair and tub seat. Will need OP once can weight bear. Pt is trying to push through the pain but it still limits her. Will continue to work on discharge needs.
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
## 2022-04-09 DIAGNOSIS — F17210 Nicotine dependence, cigarettes, uncomplicated: Secondary | ICD-10-CM

## 2022-04-09 MED ORDER — ENSURE ENLIVE PO LIQD
237.0000 mL | Freq: Two times a day (BID) | ORAL | Status: DC
  Administered 2022-04-09 – 2022-04-14 (×11): 237 mL via ORAL

## 2022-04-09 MED ORDER — POLYETHYLENE GLYCOL 3350 17 G PO PACK
17.0000 g | PACK | Freq: Every day | ORAL | Status: DC
  Administered 2022-04-09 – 2022-04-14 (×5): 17 g via ORAL
  Filled 2022-04-09 (×7): qty 1

## 2022-04-09 NOTE — Progress Notes (Signed)
Physical Therapy Session Note  Patient Details  Name: Michele Hunt MRN: [id number] Date of Birth: 12/17/1995  Today's Date: 04/09/2022  PT Individual Time Calculation (min): 60 min   Short Term Goals: Week 1:  PT Short Term Goal 1 (Week 1): STG = LTG secondary to ELOS  Skilled Therapeutic Interventions/Progress Updates: Pt missed 30 min skilled PT due to fatigue. Upon entry to pt's room pt soundly sleeping. Per primary PT pt with significant increased pain and was significantly labile earlier today. PTA allowed pt to rest and will continue efforts as schedule permits.      Therapy Documentation Precautions:  Precautions Precautions: Fall Restrictions Weight Bearing Restrictions: Yes LLE Weight Bearing: Non weight bearing Other Position/Activity Restrictions: Per ortho- "Nonweightbearing for L leg due to pelvis and unrestricted range of motion left hip and knee" General: PT Amount of Missed Time (min): 30 Minutes PT Missed Treatment Reason: Patient fatigue Vital Signs:   Pain: Pain Assessment Pain Scale: 0-10 Pain Score: 9  Pain Type: Surgical pain Pain Location: Pelvis Pain Orientation: Posterior Pain Descriptors / Indicators: Aching Pain Frequency: Constant Pain Onset: On-going Patients Stated Pain Goal: 3 Pain Intervention(s): Medication (See eMAR) Mobility:   Locomotion :    Trunk/Postural Assessment :    Balance:   Exercises:   Other Treatments:      Therapy/Group: Individual Therapy  Michele Hunt 04/09/2022, 2:39 PM
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
## 2022-04-09 NOTE — Progress Notes (Signed)
Physical Therapy Session Note  Patient Details  Name: Michele Hunt MRN: [id number] Date of Birth: 1996/03/26  Today's Date: 04/09/2022 PT Individual Time: 1100-1200 PT Individual Time Calculation (min): 60 min   Short Term Goals: Week 1:  PT Short Term Goal 1 (Week 1): STG = LTG secondary to ELOS  Skilled Therapeutic Interventions/Progress Updates:  Patient greeted sitting upright in wheelchair in room and agreeable to PT treatment session. Patient reporting 8/10 pain- RN notified and was able to administer Tylenol prior to start of treatment session. Patient propelled manual wheelchair >500' throughout the fourth floor of the hospital with B UE ModI- Patient continues to require cues for leg rest management.   Patient performed sit/stand with SBA/CGA and therapist's foot underneath patient's L foot in order to ensure she is maintaining WB precautions with good adherence noted.   Patient reported feeling light-headed throughout transfer secondary to not eating at all this morning, however taking pain medications. Therapist made graham crackers with peanut butter and patient was able to eat all 3 prior to starting seated therex. Educated patient on having family bring in meals she enjoys eating as this is a crucial part to recovery and having enough energy to participate in therapy.   Prior to therex patient became emotional regarding her hospitalization, especially current injuries, pain level, current level of function, time to recovery etc. Therapist spent an extensive amount of time discussing timeline for recovery, expected level of function at discharge and toward the end of recovery, as well as ideas of things she could occupy her time with while in the room as patient is used to being very busy with a lot of tasks to complete.   Patient completed 3 x 10 R LAQ with 5#, Unable to perform R hip flexion at this time secondary to increased pain.  Patient gait trained x15' with RW and CGA/SBA  for safety- Patient required increased time to complete secondary to increased pain. Patient was able to complete gait trial while adhering to WB precautions.    Patient returned to her room sitting upright in wheelchair with call bell within reach and all needs met.    Therapy Documentation Precautions:  Precautions Precautions: Fall Restrictions Weight Bearing Restrictions: Yes LLE Weight Bearing: Non weight bearing Other Position/Activity Restrictions: Per ortho- "Nonweightbearing for L leg due to pelvis and unrestricted range of motion left hip and knee"  Therapy/Group: Individual Therapy  Kahlee Metivier 04/09/2022, 7:57 AM

## 2022-04-09 NOTE — Progress Notes (Addendum)
Patient out of  room when this nurse entered room to do bowel program. Patient off unit and unable to start the bowel program. Will notify oncoming nurse of non started bowel program
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
## 2022-04-09 NOTE — Progress Notes (Signed)
Occupational Therapy Session Note  Patient Details  Name: Michele Hunt MRN: [id number] Date of Birth: May 09, 1996  Session 1 Today's Date: 04/09/2022 OT Individual Time: 2094-7096 OT Individual Time Calculation (min): 45 min    Session 2 Today's Date: 04/09/2022 OT Missed Time: 60 Minutes Missed Time Reason: Pain;Patient fatigue    Short Term Goals: Week 1:  OT Short Term Goal 1 (Week 1): Pt will complete 1/3 toileting steps with CGA for balance using RW OT Short Term Goal 2 (Week 1): Pt will donn LB clothing with AE PRN with overall min A OT Short Term Goal 3 (Week 1): Pt will sit > stand in prep for ADL with supervision  Skilled Therapeutic Interventions/Progress Updates:    Session 1 Pt received sitting up in the w/c with 7/10 pain in her pelvis but agreeable to OT session. Pt propelled the w/c 200 ft to the therapy gym with slow pace but no cueing required. Spent first couple minutes addressing equipment needed for home. Pt requesting print out to remember everything she will need. Started this for pt. She completed 3 trials of 1 minute standing working on functional reaching with adherence of LLE NWB. OT placed foot under pt's LLE to be able to provide continuous feedback on adherence. Overall she was 75% compliant, doing more like TDWB and requiring cueing. She required rest breaks between each set. Provided education on carryover to functional tasks. She returned to her room. Pt was left sitting up in the w/c with all needs met and call bell within reach.    Session 2 Pt supine in bed, eyes half open and reporting she is exhausted and in a lot of pain. She has just recently received pain medication. She requested OT to give her time to rest and to return. 30 min later OT returned and pt sleeping soundly. Discussed POC with PT- pt may have completed too much mobility yesterday. 60 min missed.    Therapy Documentation Precautions:  Precautions Precautions:  Fall Restrictions Weight Bearing Restrictions: Yes LLE Weight Bearing: Non weight bearing Other Position/Activity Restrictions: Per ortho- "Nonweightbearing for L leg due to pelvis and unrestricted range of motion left hip and knee"  Therapy/Group: Individual Therapy  Curtis Sites 04/09/2022, 6:42 AM

## 2022-04-10 DIAGNOSIS — R630 Anorexia: Secondary | ICD-10-CM

## 2022-04-10 DIAGNOSIS — R102 Pelvic and perineal pain: Secondary | ICD-10-CM

## 2022-04-10 NOTE — Progress Notes (Signed)
Physical Therapy Session Note  Patient Details  Name: Michele Hunt MRN: [id number] Date of Birth: 1995/10/03  Today's Date: 04/10/2022 PT Individual Time: 1st Treatment Session: 0915-1000; 2nd Treatment Session: 1400-1500 PT Individual Time Calculation (min): 45 min; 60 min  Short Term Goals: Week 1:  PT Short Term Goal 1 (Week 1): STG = LTG secondary to ELOS  Skilled Therapeutic Interventions/Progress Updates:  1st Treatment Session- Patient greeted sitting upright in wheelchair in room and agreeable to PT treatment session. Patient requesting to shower this morning and unable to earlier with OT secondary to increased pain. Patient reports 5/10 pain at this time and already medicated.   Patient performed sit/stand from wheelchair with RW and SBA with good adherence to WB precautions. Patient ambulated x10' to transfer tub bench with RW and CGA/SBA for safety- Increased time required secondary to pain and fear. Patient remained seated throughout entire shower and only required set-up assistance. Patient required Mod/MaxA for threading pants while seated secondary to increased pain and limited ROM- Patient performed sit/stand from transfer tub bench with RW and SBA and therapist assisted with drying off her backside and pulled up pants with TotalA. Patient performed stand pivot transfer to wheelchair with RW and SBA.   Patient left sitting upright in wheelchair with posey chair turned on, call bell within reach and all needs met.    2nd Treatment Session- Patient greeted supine in bed and agreeable to PT treatment session. Patient transitioned from semi-reclined to sitting EOB with supv and increased time to complete- Patient used simulated leg lifter and compensatory strategies to assist L LE off the bed. Patient performed stand pivot transfer with RW and SBA.   Patient propelled manual wheelchair from room to rehab gym with B UE and supv.   Patient gait trained 2 x 15' with RW and SBA/Supv  for safety- Good adherence to WB restrictions noted with improved fluidity of gait.   Initiated stair mobility with shower chair in order to ensure safe entrance and exit into her home. Patient ascended/descended x3 steps via shower chair with the use RW and therapist's hand as patient does not have HR at home. Once seated on the third step, patient reported her steps at home are smaller than the 6" steps in main rehab gym. Patient then hopped up/down x2 6" steps with the use of the RW- Therapist adjusted the height of the legs of the walker for the second step. Patient then ascended/descended an 8"step with RW and CGA/SBA for safety- VC for proper sequencing/technique with good ability to maintain WB restrictions throughout. No LOB noted throughout all stair/step trials. Therapist requested that she have her boyfriend take a video or picture of entrance into home.   Patient propelled manual wheelchair back to her room with distant supv. Patient left sitting upright in wheelchair with call bell within reach and all needs met.    Therapy Documentation Precautions:  Precautions Precautions: Fall Restrictions Weight Bearing Restrictions: Yes LLE Weight Bearing: Non weight bearing Other Position/Activity Restrictions: Per ortho- "Nonweightbearing for L leg due to pelvis and unrestricted range of motion left hip and knee"  Therapy/Group: Individual Therapy  Fenix Rorke 04/10/2022, 7:58 AM

## 2022-04-10 NOTE — Progress Notes (Signed)
Occupational Therapy Session Note  Patient Details  Name: Michele Hunt MRN: [id number] Date of Birth: Mar 20, 1996  Today's Date: 04/10/2022 OT Individual Time: 6767-2094 OT Individual Time Calculation (min): 41 min    Short Term Goals: Week 1:  OT Short Term Goal 1 (Week 1): Pt will complete 1/3 toileting steps with CGA for balance using RW OT Short Term Goal 2 (Week 1): Pt will donn LB clothing with AE PRN with overall min A OT Short Term Goal 3 (Week 1): Pt will sit > stand in prep for ADL with supervision  Skilled Therapeutic Interventions/Progress Updates:    Pt resting in w/c upon arrival. OT interventin with focus on w/c mobility, furniture transfers, and discharge planning. Pt propelled w/c to ADL apartment with supervision. Stand hop pivot transfer w/c<>sofa with pillow in seat. Pt required min A for stand>sit. Sit>stand from sofa with mod a. Hop pivot to w/c with CGA. Stand>sit w/c with CGA. Pt propelled w/c back to room. Pt remained in w/c with all needs within reach. Seat alarm activated.   Therapy Documentation Precautions:  Precautions Precautions: Fall Restrictions Weight Bearing Restrictions: Yes LLE Weight Bearing: Non weight bearing Other Position/Activity Restrictions: Per ortho- "Nonweightbearing for L leg due to pelvis and unrestricted range of motion left hip and knee" Pain: Pt c/o 7/10 pain Lt pelvis; repositioned   Therapy/Group: Individual Therapy  Leroy Libman 04/10/2022, 11:11 AM

## 2022-04-10 NOTE — Progress Notes (Signed)
Occupational Therapy Session Note  Patient Details  Name: Michele Hunt MRN: [id number] Date of Birth: 1996/06/13  Today's Date: 04/10/2022 OT Individual Time: 9223-0097 OT Individual Time Calculation (min): 39 min    Short Term Goals: Week 1:  OT Short Term Goal 1 (Week 1): Pt will complete 1/3 toileting steps with CGA for balance using RW OT Short Term Goal 2 (Week 1): Pt will donn LB clothing with AE PRN with overall min A OT Short Term Goal 3 (Week 1): Pt will sit > stand in prep for ADL with supervision  Skilled Therapeutic Interventions/Progress Updates:     Pt received in bed, lethargic and sleeping intermittently requesting pain medication upon entry and OT found RN and communicated need.   ADL: Pt completes ADL at overall CGA Level. Skilled interventions include: review of AE technique with min cuing, education to not stand on 1LE to fasten pants, reinforcement of WB precautions for SPT with RW to toilet, and set up of grooming needs. Pt with vape in blanket on recliner by bedside. Moved to windowsill and alerted RN. Pt at end of session attempting to retrieve it and educated on tabacco free campus and pt moved it into clothing pile acknowledging she should not have it.   Pt left at end of session in w/c with exit alarm on, call light in reach and all needs met   Therapy Documentation Precautions:  Precautions Precautions: Fall Restrictions Weight Bearing Restrictions: Yes LLE Weight Bearing: Non weight bearing Other Position/Activity Restrictions: Per ortho- "Nonweightbearing for L leg due to pelvis and unrestricted range of motion left hip and knee" General:    Therapy/Group: Individual Therapy  Tonny Branch 04/10/2022, 6:54 AM
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
## 2022-04-10 NOTE — Plan of Care (Signed)
  Problem: Consults Goal: RH GENERAL PATIENT EDUCATION Description: See Patient Education module for education specifics. 04/10/2022 1041 by Salina April, RN Outcome: Progressing 04/10/2022 1041 by Salina April, RN Outcome: Progressing   Problem: RH BOWEL ELIMINATION Goal: RH STG MANAGE BOWEL WITH ASSISTANCE Description: STG Manage Bowel with mod I  Assistance. 04/10/2022 1041 by Salina April, RN Outcome: Progressing 04/10/2022 1041 by Salina April, RN Outcome: Progressing Goal: RH STG MANAGE BOWEL W/MEDICATION W/ASSISTANCE Description: STG Manage Bowel with Medication with mod I  Assistance. 04/10/2022 1041 by Salina April, RN Outcome: Progressing 04/10/2022 1041 by Salina April, RN Outcome: Progressing   Problem: RH BLADDER ELIMINATION Goal: RH STG MANAGE BLADDER WITH ASSISTANCE Description: STG Manage Bladder With mod I Assistance 04/10/2022 1041 by Salina April, RN Outcome: Progressing 04/10/2022 1041 by Salina April, RN Outcome: Progressing   Problem: RH SKIN INTEGRITY Goal: RH STG SKIN FREE OF INFECTION/BREAKDOWN Description: Manage w min assist 04/10/2022 1041 by Salina April, RN Outcome: Progressing 04/10/2022 1041 by Salina April, RN Outcome: Progressing Goal: RH STG ABLE TO PERFORM INCISION/WOUND CARE W/ASSISTANCE Description: STG Able To Perform Incision/Wound Care With min  Assistance. 04/10/2022 1041 by Salina April, RN Outcome: Progressing 04/10/2022 1041 by Salina April, RN Outcome: Progressing   Problem: RH SAFETY Goal: RH STG ADHERE TO SAFETY PRECAUTIONS W/ASSISTANCE/DEVICE Description: STG Adhere to Safety Precautions With cues Assistance/Device. 04/10/2022 1041 by Salina April, RN Outcome: Progressing 04/10/2022 1041 by Salina April, RN Outcome: Progressing   Problem: RH PAIN MANAGEMENT Goal: RH STG PAIN MANAGED AT OR BELOW PT'S PAIN GOAL Description: < 4 with prns 04/10/2022 1041 by Salina April, RN Outcome:  Progressing 04/10/2022 1041 by Salina April, RN Outcome: Progressing   Problem: RH KNOWLEDGE DEFICIT GENERAL Goal: RH STG INCREASE KNOWLEDGE OF SELF CARE AFTER HOSPITALIZATION Description: Patient will be able to manage care at discharge using handouts and educational resources independently 04/10/2022 1041 by Salina April, RN Outcome: Progressing 04/10/2022 1041 by Salina April, RN Outcome: Progressing   Problem: Education: Goal: Verbalization of understanding the information provided (i.e., activity precautions, restrictions, etc) will improve 04/10/2022 1041 by Salina April, RN Outcome: Progressing 04/10/2022 1041 by Salina April, RN Outcome: Progressing Goal: Individualized Educational Video(s) 04/10/2022 1041 by Salina April, RN Outcome: Progressing 04/10/2022 1041 by Salina April, RN Outcome: Progressing   Problem: Activity: Goal: Ability to ambulate and perform ADLs will improve 04/10/2022 1041 by Salina April, RN Outcome: Progressing 04/10/2022 1041 by Salina April, RN Outcome: Progressing   Problem: Clinical Measurements: Goal: Postoperative complications will be avoided or minimized Outcome: Progressing   Problem: Self-Concept: Goal: Ability to maintain and perform role responsibilities to the fullest extent possible will improve Outcome: Progressing   Problem: Pain Management: Goal: Pain level will decrease Outcome: Progressing

## 2022-04-11 NOTE — Plan of Care (Signed)
  Problem: RH BOWEL ELIMINATION Goal: RH STG MANAGE BOWEL WITH ASSISTANCE Description: STG Manage Bowel with mod I  Assistance. Outcome: Not Progressing;bowel program

## 2022-04-11 NOTE — Progress Notes (Addendum)
Occupational Therapy Session Note  Patient Details  Name: MALAIA BUCHTA MRN: [id number] Date of Birth: Nov 16, 1995  Today's Date: 04/11/2022 OT Individual Time: 2353-6144, 145-215 OT Individual Time Calculation (min): 60 min , 66mins   Short Term Goals: Week 1:  OT Short Term Goal 1 (Week 1): Pt will complete 1/3 toileting steps with CGA for balance using RW OT Short Term Goal 2 (Week 1): Pt will donn LB clothing with AE PRN with overall min A OT Short Term Goal 3 (Week 1): Pt will sit > stand in prep for ADL with supervision  Skilled Therapeutic Interventions/Progress Updates:   (1) OT session: Patient sleep in bed upon arrival, indicated that she was tired, however, she was in agreement to perform a BADL related task in showering.  The pt reported a pain response of 6 on a 0-10 scale and indicated that nursing had just provided medication .  The pt was able to transfer from bed LOF to EOB with MinA for managing BLE, the pt was able to transfer to the w/c using a stand pivot transfer incorporating the RW with MinA.  The pt was rolled to the shower and she was able to enter the shower using the grab bars for additional balance while side stepping for proper placement onto the shower chair adhering to precaution for LLE.  The pt was MaxA for removal of socks, she was able to incorporate the grab bars for removing her pants with MinA, while standing.  The pt  was able to remove her over head shirt with SBA.  The pt was able to bathe herself with close S for her UB and her LB exclusive of her feet, which she was instructed in the use of AE, long handle sponge with MinA .  The pt was able to dry her UB off with close S, she was ModA for BLE inclusive of her lower legs and feet.  The pt was able to donn her bra and over head shirt with SBA, she was MinA with donning her pant prior to exiting the rest room.  The pt went on to complete UB exercise using medium grade theraband for shld flexion and chest press,  followed by elbow extension, 2 sets of 15 with rest break as needed.The pt required 1 rest break.  The pt returned to bed LOF using a stand pivot transfer incorporating the bed rail and the RW for additional balance. The call light and bedside table were within reach and the bed alarm was activated.   (2) OT session:  Patient in bed asleep upon arrival, pt agreed to working on UB exercise at EOB.  The indicated that she has a pain response of 9 on 0-10 scale, nursing was made aware. The pt went on to come from supine in bed to EOB with MinA for managing BLE  The pt completed a table top activity in standing, adhering fully to LLE  precautionary measures while retrieving suction cones from the bedside table.  The pt requied 2 rest break secondary to pain response. Nursing arrived to administered pain meds and the pt requested returning to bed LOF secondary to pain.  The  bedside table and call light were both place within reach, with the bed alarm activated.  Therapy Documentation Precautions:  Precautions Precautions: Fall Restrictions Weight Bearing Restrictions: Yes LLE Weight Bearing: Non weight bearing Other Position/Activity Restrictions: Per ortho- "Nonweightbearing for L leg due to pelvis and unrestricted range of motion left hip and knee"  Therapy/Group: Individual Therapy  Lavona Mound 04/11/2022, 12:08 PM

## 2022-04-11 NOTE — Progress Notes (Signed)
Physical Therapy Session Note  Patient Details  Name: Michele Hunt MRN: [id number] Date of Birth: 02-Dec-1995  Today's Date: 04/11/2022 PT Individual Time: 1st Treatment Session: 0800-0900; 2nd Treatment Session: Refused PT Individual Time Calculation (min): 60 min; 0 min  Short Term Goals: Week 1:  PT Short Term Goal 1 (Week 1): STG = LTG secondary to ELOS  Skilled Therapeutic Interventions/Progress Updates:  1st Treatment Session- Patient greeted sitting upright in wheelchair in room and agreeable to PT treatment session. Patient propelled manual wheelchair from room to rehab gym ModI.   Patient gait trained 2 x 35', x50' with RW and Supv for safety- Patient demonstrated good adherence to WB restrictions throughout gait trial.   Patient attempted to perform sit/stand without the use of a RW, however unable to secondary to L hip pain- Activity discontinued at this time and patient performed x10 sit/stands with R UE supported on RW with supv for safety.   Patient ascended/descended an 8" step with RW and SBA for safety- VC for sequencing with good stability and ability to maintain NWB precautions throughout curb mobility.   Patient propelled manual wheelchair back to her room with B UE ModI. Patient requesting to use the restroom once back in her room- Patient performed stand pivot transfer from wheelchair to toilet with RW and SBA. Patient was able to manage her pants with Supv and transition from standing to sitting with the use of the grab bar for improved stability. Patient agreeable to pulling the cord when ready to stand and NT notified and aware.    2nd Treatment Session- Patient greeted supine in bed asleep and initially not agreeable to PT treatment session- Therapist returned 15 minutes later and patient continued to refuse treatment session reporting increased lethargy this afternoon. Patient left supine in bed with call bell within reach and all needs met.    Therapy  Documentation Precautions:  Precautions Precautions: Fall Restrictions Weight Bearing Restrictions: Yes LLE Weight Bearing: Non weight bearing Other Position/Activity Restrictions: Per ortho- "Nonweightbearing for L leg due to pelvis and unrestricted range of motion left hip and knee"  Therapy/Group: Individual Therapy  Glenna Brunkow 04/11/2022, 7:55 AM

## 2022-04-12 MED ORDER — PAROXETINE HCL 20 MG PO TABS
20.0000 mg | ORAL_TABLET | Freq: Every day | ORAL | Status: DC
  Administered 2022-04-12 – 2022-04-15 (×4): 20 mg via ORAL
  Filled 2022-04-12 (×4): qty 1

## 2022-04-12 NOTE — Progress Notes (Signed)
Patient refused bowel program; claims she goes oh her own without medications.
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
## 2022-04-12 NOTE — Plan of Care (Signed)
  Problem: RH BOWEL ELIMINATION Goal: RH STG MANAGE BOWEL WITH ASSISTANCE Description: STG Manage Bowel with mod I  Assistance. Outcome: Progressing; patient on bowel program per order she claims she goes on her own without laxatives and suppository.

## 2022-04-13 DIAGNOSIS — F32A Depression, unspecified: Secondary | ICD-10-CM

## 2022-04-13 LAB — BASIC METABOLIC PANEL
Anion gap: 9 (ref 5–15)
BUN: 18 mg/dL (ref 6–20)
CO2: 24 mmol/L (ref 22–32)
Calcium: 9.1 mg/dL (ref 8.9–10.3)
Chloride: 103 mmol/L (ref 98–111)
Creatinine, Ser: 0.55 mg/dL (ref 0.44–1.00)
GFR, Estimated: >60 mL/min (ref >60)
Glucose, Bld: 100 mg/dL — ABNORMAL HIGH (ref 70–99)
Potassium: 4 mmol/L (ref 3.5–5.1)
Sodium: 136 mmol/L (ref 135–145)

## 2022-04-13 LAB — CBC
HCT: 26.4 % — ABNORMAL LOW (ref 36.0–46.0)
Hemoglobin: 9 g/dL — ABNORMAL LOW (ref 12.0–15.0)
MCH: 32.5 pg (ref 26.0–34.0)
MCHC: 34.1 g/dL (ref 30.0–36.0)
MCV: 95.3 fL (ref 80.0–100.0)
Platelets: 368 10*3/uL (ref 150–400)
RBC: 2.77 MIL/uL — ABNORMAL LOW (ref 3.87–5.11)
RDW: 12.3 % (ref 11.5–15.5)
WBC: 7.9 10*3/uL (ref 4.0–10.5)
nRBC: 0 % (ref 0.0–0.2)

## 2022-04-13 NOTE — Progress Notes (Signed)
Physical Therapy Session Note  Patient Details  Name: Michele Hunt MRN: [id number] Date of Birth: [birth year]  Today's Date: 04/13/2022 PT Individual Time: 1st Treatment Session: 1100-1200; 2nd Treatment Session: 1430-1530 PT Individual Time Calculation (min): 60 min; 60 min  Short Term Goals: Week 1:  PT Short Term Goal 1 (Week 1): STG = LTG secondary to ELOS  Skilled Therapeutic Interventions/Progress Updates:  1st Treatment Session- Patient greeted sitting upright in wheelchair in room  and agreeable to PT treatment session. Patient propelled manual wheelchair from room to ortho gym with B UE ModI.  Patient was able to properly set-up her wheelchair and manage all the parts for a simulated car transfer. Car simulator was raised to 42" in order to simulate transferring into/out of a dually truck with foot boards. Patient performed stand pivot transfer with RW and Supv and then hopped onto 8" stool to simulate running board. Once seated, patient was able to independently place B LE into/out of the car.   Patient gait trained x15' with RW and Supv- Good adherence to WB restrictions throughout.   Patient verbalizing wanting to practice rolling onto her R side so she can sleep in sidelying at night, however reports increased fear of pain limited her from trying last night. Patient transitioned from sitting edge of mat table to long-sitting with Supv and increased time- VC for improved muscle activation of L LE.   Seated therex performed in order to strengthen and improve L LE ROM for I'm,proved functional mobility- L Heel slides on towel, 3 x 15 L LAQ, 3 x 10 Sustained L hamstring stretch Sustained L adductors stretch L Hip flexion with limited ROM, 3 x 10  Patient propelled manual wheelchair back to her room with B UE ModI.    Patient returned to her room and left sitting upright in wheelchair with call bell within reach and all needs met.    2nd Treatment Session- Patient greeted  supine in bed with sister present and agreeable to PT treatment session. Patient transitioned from supine to sitting EOB with supv. Patient performed sit/stand and ambulated ~20' to bathroom with RW and Supv for safety. Patient was able to manage pants and pericare while standing with sister present for safety. Patient stood at sink while washing her hands with RW and Supv.   Patient propelled manual wheelchair form her room to rehab gym with B UE ModI.   Patient performed sit/stand and stand pivot transfer to/from wheelchair and mat table with distant supv. Patient transitioned to/from sitting EOM to semi-reclined with Supv.   Supine therex to increase L LE strength and ROM- L heel slides on beezy-board AROM, 3 x 10 L abd/adduction on beezy-board AAROM, 3 x 10 Increased time required to complete the above exercises secondary to difficulty and pain limiting ROM.   Patient gait trained x25' with RW and distant supv- Good adherence to WB restrictions.    Patient returned to her room sitting upright in wheelchair with call bell within reach and all needs met.    Therapy Documentation Precautions:  Precautions Precautions: Fall Restrictions Weight Bearing Restrictions: Yes LLE Weight Bearing: Non weight bearing Other Position/Activity Restrictions: Per ortho- "Nonweightbearing for L leg due to pelvis and unrestricted range of motion left hip and knee"  Therapy/Group: Individual Therapy  Brennden Masten 04/13/2022, 7:54 AM
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
## 2022-04-13 NOTE — Progress Notes (Addendum)
Occupational Therapy Session Note  Patient Details  Name: Michele Hunt MRN: [id number] Date of Birth: 12-Oct-1995  Today's Date: 04/13/2022 OT Individual Time: 0845-1000 OT Individual Time Calculation (min): 75 min    Short Term Goals: Week 1:  OT Short Term Goal 1 (Week 1): Pt will complete 1/3 toileting steps with CGA for balance using RW OT Short Term Goal 2 (Week 1): Pt will donn LB clothing with AE PRN with overall min A OT Short Term Goal 3 (Week 1): Pt will sit > stand in prep for ADL with supervision  Skilled Therapeutic Interventions/Progress Updates:    Upon OT arrival, pt supine in bed with significant other present. Pt reports minimal pain and was recently medicated. Pt requesting a shower. Pt completes supine to sit transfer with Min A and squat pivot transfer with CGA. Pt completes full shower ADL at the levels below. Discussed recommendation for AE, grab bars, and DME and verbalizes understanding. Pt propels herself in room with SBA via w/c and performs sit<>stand transfers with CGA. Pt maintains NWB precautions throughout all transfers and standing tasks. Increased time required to perform all aspects. Pt was left in w/c at end of session with all needs met.  Therapy Documentation Precautions:  Precautions Precautions: Fall Restrictions Weight Bearing Restrictions: Yes LLE Weight Bearing: Non weight bearing Other Position/Activity Restrictions: Per ortho- "Nonweightbearing for L leg due to pelvis and unrestricted range of motion left hip and knee"  ADL: Grooming: Supervision/safety Where Assessed-Grooming: Sitting at sink Upper Body Bathing: Supervision/safety Where Assessed-Upper Body Bathing: Shower Lower Body Bathing: Supervision/safety Where Assessed-Lower Body Bathing: Shower Upper Body Dressing: Supervision/safety Where Assessed-Upper Body Dressing: Wheelchair Lower Body Dressing: Contact guard Where Assessed-Lower Body Dressing: Airline pilot: Curator Method: Education officer, environmental: Transfer tub bench    Therapy/Group: Individual Therapy  Marvetta Gibbons 04/13/2022, 9:57 AM
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
## 2022-04-14 ENCOUNTER — Other Ambulatory Visit (HOSPITAL_COMMUNITY): Payer: Self-pay

## 2022-04-14 MED ORDER — HYDROMORPHONE HCL 4 MG PO TABS
6.0000 mg | ORAL_TABLET | Freq: Four times a day (QID) | ORAL | 0 refills | Status: DC | PRN
  Filled 2022-04-14: qty 42, 7d supply, fill #0

## 2022-04-14 MED ORDER — METHOCARBAMOL 500 MG PO TABS
1000.0000 mg | ORAL_TABLET | Freq: Three times a day (TID) | ORAL | 0 refills | Status: DC
  Filled 2022-04-14: qty 180, 30d supply, fill #0

## 2022-04-14 MED ORDER — VITAMIN D (ERGOCALCIFEROL) 1.25 MG (50000 UNIT) PO CAPS
50000.0000 [IU] | ORAL_CAPSULE | ORAL | 0 refills | Status: DC
  Filled 2022-04-14: qty 5, 35d supply, fill #0

## 2022-04-14 MED ORDER — RIVAROXABAN 10 MG PO TABS
10.0000 mg | ORAL_TABLET | Freq: Every day | ORAL | 0 refills | Status: DC
  Filled 2022-04-14: qty 30, 30d supply, fill #0

## 2022-04-14 MED ORDER — APIXABAN 2.5 MG PO TABS
2.5000 mg | ORAL_TABLET | Freq: Two times a day (BID) | ORAL | 0 refills | Status: DC
  Filled 2022-04-14: qty 60, 30d supply, fill #0

## 2022-04-14 MED ORDER — SENNOSIDES-DOCUSATE SODIUM 8.6-50 MG PO TABS
1.0000 | ORAL_TABLET | Freq: Two times a day (BID) | ORAL | 0 refills | Status: DC
  Filled 2022-04-14: qty 60, 30d supply, fill #0

## 2022-04-14 MED ORDER — PREGABALIN 100 MG PO CAPS
100.0000 mg | ORAL_CAPSULE | Freq: Three times a day (TID) | ORAL | 0 refills | Status: DC
  Filled 2022-04-14: qty 90, 30d supply, fill #0

## 2022-04-14 MED ORDER — HYDROMORPHONE HCL 2 MG PO TABS: 4.0000 mg | ORAL_TABLET | Freq: Four times a day (QID) | ORAL | Status: DC | PRN

## 2022-04-14 MED ORDER — POLYETHYLENE GLYCOL 3350 17 GM/SCOOP PO POWD
17.0000 g | Freq: Two times a day (BID) | ORAL | 0 refills | Status: DC
  Filled 2022-04-14: qty 238, 7d supply, fill #0

## 2022-04-14 MED ORDER — ADULT MULTIVITAMIN W/MINERALS CH
1.0000 | ORAL_TABLET | Freq: Every day | ORAL | 0 refills | Status: DC
  Filled 2022-04-14: qty 30, 30d supply, fill #0

## 2022-04-14 MED ORDER — ACETAMINOPHEN 325 MG PO TABS
325.0000 mg | ORAL_TABLET | ORAL | 0 refills | Status: DC | PRN
  Filled 2022-04-14: qty 30, 3d supply, fill #0

## 2022-04-14 MED ORDER — HYDROMORPHONE HCL 2 MG PO TABS
4.0000 mg | ORAL_TABLET | Freq: Four times a day (QID) | ORAL | Status: DC | PRN
  Administered 2022-04-14 – 2022-04-15 (×4): 6 mg via ORAL
  Filled 2022-04-14 (×5): qty 3

## 2022-04-14 MED ORDER — FOLIC ACID 1 MG PO TABS
1.0000 mg | ORAL_TABLET | Freq: Every day | ORAL | 0 refills | Status: DC
  Filled 2022-04-14: qty 30, 30d supply, fill #0

## 2022-04-14 MED ORDER — APIXABAN 2.5 MG PO TABS
2.5000 mg | ORAL_TABLET | Freq: Two times a day (BID) | ORAL | Status: DC
  Administered 2022-04-15: 2.5 mg via ORAL
  Filled 2022-04-14: qty 1

## 2022-04-14 MED ORDER — POLYETHYLENE GLYCOL 3350 17 G PO PACK
17.0000 g | PACK | Freq: Two times a day (BID) | ORAL | Status: DC
  Administered 2022-04-14 – 2022-04-15 (×2): 17 g via ORAL
  Filled 2022-04-14 (×2): qty 1

## 2022-04-14 MED ORDER — PAROXETINE HCL 20 MG PO TABS
20.0000 mg | ORAL_TABLET | Freq: Every day | ORAL | 0 refills | Status: DC
  Filled 2022-04-14: qty 30, 30d supply, fill #0

## 2022-04-14 NOTE — TOC Transition Note (Signed)
Eleven (11) discharge meds in the [place] on the second floor for patient until discharge.

## 2022-04-14 NOTE — Progress Notes (Signed)
Physical Therapy Discharge Summary  Patient Details  Name: Michele Hunt MRN: [id number] Date of Birth: April 08, 1996  Date of Discharge from PT service:April 14, 2022  Today's Date: 04/14/2022 PT Individual Time: 0900-1000 PT Individual Time Calculation (min): 60 min    Patient has met 7 of 7 long term goals due to improved activity tolerance, improved balance, improved postural control, increased strength, increased range of motion, and decreased pain.  Patient to discharge at a wheelchair level Modified Independent.   Patient's care partner is independent to provide the necessary  Supv  assistance at discharge.  Reasons goals not met: NA  Recommendation:  Patient will benefit from ongoing skilled PT services in outpatient setting, once WB status is upgraded, to continue to advance safe functional mobility, address ongoing impairments in decreased L LE ROM and strength and minimize fall risk.  Equipment: Wheelchair, cushion  Reasons for discharge: treatment goals met and discharge from hospital  Patient/family agrees with progress made and goals achieved: Yes  PT Discharge Precautions/Restrictions Precautions Precautions: Fall Restrictions Weight Bearing Restrictions: Yes LLE Weight Bearing: Non weight bearing Other Position/Activity Restrictions: Per ortho- "Nonweightbearing for L leg due to pelvis and unrestricted range of motion left hip and knee" Pain Pain Assessment Pain Scale: 0-10 Pain Score: 8  Pain Type: Surgical pain Pain Location: Hip Pain Orientation: Left Pain Descriptors / Indicators: Aching Pain Frequency: Constant Pain Onset: On-going Pain Intervention(s): Medication (See eMAR) Pain Interference Pain Interference Pain Effect on Sleep: 3. Frequently Pain Interference with Therapy Activities: 1. Rarely or not at all Pain Interference with Day-to-Day Activities: 1. Rarely or not at all Vision/Perception  Vision - History Ability to See in Adequate  Light: 0 Adequate Perception Perception: Within Functional Limits Praxis Praxis: Intact  Cognition Overall Cognitive Status: Within Functional Limits for tasks assessed Arousal/Alertness: Awake/alert Orientation Level: Oriented X4 Year: 2023 Month: October Day of Week: Correct Memory: Appears intact Awareness: Appears intact Problem Solving: Appears intact Safety/Judgment: Appears intact Sensation Sensation Light Touch: Appears Intact Proprioception: Appears Intact Coordination Gross Motor Movements are Fluid and Coordinated: Yes Fine Motor Movements are Fluid and Coordinated: Yes Coordination and Movement Description: Coordination limited secondary to NWB restrictions and pain Motor  Motor Motor: Other (comment) Motor - Discharge Observations: Pain and WB restrictions limiting functional mobility and motor planning, however this has improved since evaluation  Mobility Bed Mobility Bed Mobility: Supine to Sit;Sit to Supine Supine to Sit: Independent with assistive device Sit to Supine: Independent with assistive device Transfers Transfers: Sit to Stand;Stand to Sit;Stand Pivot Transfers Sit to Stand: Independent with assistive device Stand to Sit: Independent with assistive device Stand Pivot Transfers: Independent with assistive device Transfer (Assistive device): Rolling walker Locomotion  Gait Ambulation: Yes Gait Assistance: Independent with assistive device Gait Distance (Feet): 50 Feet Assistive device: Rolling walker Gait Gait: Yes Gait Pattern: Antalgic;Step-to pattern Gait velocity: Decreased Stairs / Additional Locomotion Stairs: Yes Stairs Assistance: Supervision/Verbal cueing Number of Stairs: 2 Height of Stairs: 6 Ramp: Independent with assistive device Curb: Engineer, maintenance (IT) Mobility: Yes Wheelchair Assistance: Independent with Camera operator: Both upper extremities Wheelchair  Parts Management: Independent Distance: >150'  Trunk/Postural Assessment  Cervical Assessment Cervical Assessment: Within Functional Limits Thoracic Assessment Thoracic Assessment: Within Functional Limits Lumbar Assessment Lumbar Assessment: Exceptions to [facility] (Patient continues to demonstrate R lateral lean when seated secondary to L hip pain) Postural Control Postural Control: Deficits on evaluation (Delayed secondary to NWB restrictions of L LE)  Balance Balance Balance Assessed:  Yes Static Sitting Balance Static Sitting - Balance Support: Feet supported;No upper extremity supported Static Sitting - Level of Assistance: 7: Independent Dynamic Sitting Balance Dynamic Sitting - Balance Support: Feet supported;No upper extremity supported Dynamic Sitting - Level of Assistance: 7: Independent Dynamic Standing Balance Dynamic Standing - Balance Support: During functional activity;Bilateral upper extremity supported Dynamic Standing - Level of Assistance: 6: Modified independent (Device/Increase time) Extremity Assessment      RLE Assessment RLE Assessment: Within Functional Limits General Strength Comments: R LE strength assessed with functional mobility, grossly 4/5- Limited secondary to pain LLE Assessment LLE Assessment: Exceptions to [facility] General Strength Comments: Grossly 3/5, however limited secondary to pain and NWB restrictions  Skilled Intervention: Patient greeted sitting upright in wheelchair in room and agreeable to PT treatment session. Patient requesting to brush her teeth at the sink prior to start of session.   Patient propelled manual wheelchair from room to ortho gym independently. Patient performed car transfer with boyfriend present ModI. Patient ascended/descended an 8" step with 4" step on top with RW and Supv for safety. Patient and boyfriend requesting to discharge home tomorrow vs Thursday. Therapist communicated this to care team and everyone agreeable to  discharge tomorrow.   Patient gait trained 69' with RW ModI. Patient is ModI with all sit/stands and transfers with RW. Patient returned to her room sitting upright in wheelchair with boyfriend present and all needs met. Equipment was delivered to patient's room prior to end of session. Patient was made independent in room with RW.    Kymberley Raz 04/14/2022, 9:52 AM

## 2022-04-14 NOTE — Progress Notes (Signed)
Occupational Therapy Discharge Summary  Patient Details  Name: Michele Hunt MRN: [id number] Date of Birth: 01-13-1996  Date of Discharge from [place] 24, 2023  Today's Date: 04/14/2022 OT Individual Time: 1350-1500 OT Individual Time Calculation (min): 70 min    Patient has met 8 of 8 long term goals due to improved activity tolerance, improved balance, postural control, ability to compensate for deficits, and improved coordination.  Patient to discharge at overall Modified Independent level.  Patient's care partner is independent to provide the necessary physical assistance at discharge. Family education completed with pt's boyfriend Hendricks Limes who feels comfortable providing assist needed at home.   Recommendation:  No further OT needs.   Equipment: TTB  Reasons for discharge: treatment goals met and discharge from hospital  Patient/family agrees with progress made and goals achieved: Yes  Skilled OT intervention: Pt received supine with  5/10 c/o pain in her pelvis, agreeable to OT session. She came to EOB with mod I. Mod I to stand from EOB and for ambulatory transfer into the bathroom with the RW, requiring close (S) during navigation of inclined threshold in the bathroom. She completed 3/3 toileting tasks with mod I using the regular toilet. Great adherence to LLE TDWB. She propelled the w/c to the therapy gym with mod I. Created HEP with pt, incorporating both LLE AROM and BUE strengthening. Pt requiring hands on facilitation for LLE abduction and hip flexion when supine on the mat. Pt with limited glute activation supine, completing 2x10 glute sets. She sat EOM and returend demo of all UE exercises listed below. Pt returned to her room and was left supine with all needs met.    [email or web address]  Access Code: [email or web address]   OT Discharge Precautions/Restrictions  Precautions Precautions: Fall Restrictions Weight Bearing Restrictions: Yes LLE Weight Bearing: Non  weight bearing Other Position/Activity Restrictions: Per ortho- "Nonweightbearing for L leg due to pelvis and unrestricted range of motion left hip and knee"  Pain Pain Assessment Pain Scale: 0-10 Pain Score: 8  Pain Type: Surgical pain Pain Location: Hip Pain Orientation: Left Pain Descriptors / Indicators: Aching Pain Frequency: Constant Pain Onset: On-going Pain Intervention(s): Medication (See eMAR) ADL ADL Eating: Independent Where Assessed-Eating: Bed level Grooming: Independent Where Assessed-Grooming: Sitting at sink Upper Body Bathing: Modified independent Where Assessed-Upper Body Bathing: Shower Lower Body Bathing: Modified independent Where Assessed-Lower Body Bathing: Shower Upper Body Dressing: Modified independent (Device) Where Assessed-Upper Body Dressing: Sitting at sink Lower Body Dressing: Modified independent Where Assessed-Lower Body Dressing: Sitting at sink Toileting: Modified independent Where Assessed-Toileting: Bedside Commode Toilet Transfer: Modified independent Toilet Transfer Method: Stand pivot Science writer: Radiographer, therapeutic: Modified independent Clinical cytogeneticist Method: Psychologist, educational: Modified independent Social research officer, government Method: Radiographer, therapeutic: Radio broadcast assistant Vision Baseline Vision/History: 0 No visual deficits Patient Visual Report: No change from baseline Vision Assessment?: No apparent visual deficits Perception  Perception: Within Functional Limits Praxis Praxis: Intact Cognition Cognition Overall Cognitive Status: Within Functional Limits for tasks assessed Arousal/Alertness: Awake/alert Orientation Level: Person;Place;Situation Person: Oriented Place: Oriented Situation: Oriented Memory: Appears intact Awareness: Appears intact Problem Solving: Appears intact Safety/Judgment: Appears intact Brief Interview for Mental Status  (BIMS) Repetition of Three Words (First Attempt): 3 Temporal Orientation: Year: Correct Temporal Orientation: Month: Accurate within 5 days Temporal Orientation: Day: Correct Recall: "Sock": Yes, no cue required Recall: "Blue": Yes, no cue required Recall: "Bed": Yes, no cue required BIMS Summary Score: 15 Sensation Sensation Light Touch: Appears Intact  Hot/Cold: Not tested Proprioception: Appears Intact Coordination Gross Motor Movements are Fluid and Coordinated: Yes Fine Motor Movements are Fluid and Coordinated: Yes Coordination and Movement Description: Coordination limited secondary to NWB restrictions and pain Motor  Motor Motor: Other (comment) Motor - Discharge Observations: Pain and WB restrictions limiting functional mobility and motor planning, however this has improved since evaluation Mobility  Bed Mobility Bed Mobility: Supine to Sit;Sit to Supine Rolling Right: Independent Rolling Left: Independent Supine to Sit: Independent with assistive device Sit to Supine: Independent with assistive device Transfers Sit to Stand: Independent with assistive device Stand to Sit: Independent with assistive device  Trunk/Postural Assessment  Cervical Assessment Cervical Assessment: Within Functional Limits Thoracic Assessment Thoracic Assessment: Within Functional Limits Lumbar Assessment Lumbar Assessment: Exceptions to [facility] (pt pain guarding with R lateral lean when sitting) Postural Control Postural Control: Deficits on evaluation (delayed 2/2 NWB LLE)  Balance Balance Balance Assessed: Yes Static Sitting Balance Static Sitting - Balance Support: Feet supported;No upper extremity supported Static Sitting - Level of Assistance: 7: Independent Dynamic Sitting Balance Dynamic Sitting - Balance Support: Feet supported;No upper extremity supported Dynamic Sitting - Level of Assistance: 7: Independent Static Standing Balance Static Standing - Balance Support: During  functional activity;Bilateral upper extremity supported Static Standing - Level of Assistance: 6: Modified independent (Device/Increase time) Dynamic Standing Balance Dynamic Standing - Balance Support: During functional activity;Bilateral upper extremity supported Dynamic Standing - Level of Assistance: 6: Modified independent (Device/Increase time) Extremity/Trunk Assessment RUE Assessment RUE Assessment: Within Functional Limits LUE Assessment LUE Assessment: Within Functional Limits   Curtis Sites 04/14/2022, 11:42 AM
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
## 2022-04-14 NOTE — Progress Notes (Signed)
Occupational Therapy Session Note  Patient Details  Name: Michele Hunt MRN: [id number] Date of Birth: 1996/05/11  Today's Date: 04/14/2022 OT Individual Time: 1100-1210 OT Individual Time Calculation (min): 70 min    Short Term Goals: Week 2:  OT Short Term Goal 1 (Week 2): STG=LTG d/t ELOS  Skilled Therapeutic Interventions/Progress Updates:    Pt received supine with 7/10 pain in her pelvis, reporting she is premedicated, agreeable to OT session. Bf Hendricks Limes present and completing family education in anticipation of tomorrow d/c. Verbal education provided re fall risk reduction, energy conservation strategies, home carryover of transfer training, ADLs, and IADLs. Demonstration and hands on training completed for pt performance of UB/LB bathing and dressing at mod I level, toileting hygiene and transfers, and shower transfers using the tub in the ADL apt. Pt able to propel w/c to the ADL apt with mod I overall. She returned to the room via w/c. Ambulatory transfer using the RW into the bathroom at close (S) level when navigating threshold. She showered seated on the TTB with mod I overall. She transferred to the w/c with mod I using the RW. Dressing at mod I level overall sit <> stand. Assisted pt in packing clothing up into bags, pt appropiately asking for assist. She transferred back to bed and was left with all needs within reach.     Therapy Documentation Precautions:  Precautions Precautions: Fall Restrictions Weight Bearing Restrictions: Yes LLE Weight Bearing: Non weight bearing Other Position/Activity Restrictions: Per ortho- "Nonweightbearing for L leg due to pelvis and unrestricted range of motion left hip and knee"  Therapy/Group: Individual Therapy  Curtis Sites 04/14/2022, 10:48 AM

## 2022-04-14 NOTE — Progress Notes (Signed)
Inpatient Rehabilitation Care Coordinator Discharge Note   Patient Details  Name: Michele Hunt MRN: [id number] Date of Birth: [birth year]   Discharge location: [place] EDUCATED ON HER CARE.  Length of Stay: 9 DAYS  Discharge activity level: MOD/I WHEELCHAIR LEVEL  Home/community participation: ACTIVE  Patient response ZL:DJTTSV Literacy - How often do you need to have someone help you when you read instructions, pamphlets, or other written material from your doctor or pharmacy?: Never  Patient response XB:LTJQZE Isolation - How often do you feel lonely or isolated from those around you?: Never  Services provided included: MD, RD, PT, OT, RN, CM, TR, Pharmacy, Neuropsych, SW  Financial Services:  Charity fundraiser Utilized: Other (Comment) (UNINSURED)    Choices offered to/list presented to: PT  Follow-up services arranged:  DME, Patient/Family has no preference for HH/DME agencies WILL NEED OP THERAPIES ONCE CAN WB. HOME EXERCISE PROGRAM GIVEN TO PT      DME : [facility], 3 IN 1 AND TUB BENCH    Patient response to transportation need: Is the patient able to respond to transportation needs?: Yes In the past 12 months, has lack of transportation kept you from medical appointments or from getting medications?: No In the past 12 months, has lack of transportation kept you from meetings, work, or from getting things needed for daily living?: No    Comments (or additional information):BOYFRIEND WAS HERE DAILY AND ASSISTED WITH HER CARE, MAIN ISSUE WAS PAIN. DID WELL AND WANTED TO LEAVE DAY EARLY IS PRIVATE PAY HERE.   Patient/Family verbalized understanding of follow-up arrangements:  Yes  Individual responsible for coordination of the follow-up plan: SELF  [phone]  Confirmed correct DME delivered: Elease Hashimoto 04/14/2022    Elease Hashimoto

## 2022-04-14 NOTE — Progress Notes (Signed)
Patient ID: Michele Hunt, female   DOB: [birth year], 26 y.o.   MRN: [id number]  Pt has asked to move up discharge to tomorrow. Her boyfriend has been educated on her care. Will get equipment here and put match in for prescription assistance. Team is fine with along with MD

## 2022-04-15 NOTE — Progress Notes (Signed)
INPATIENT REHABILITATION DISCHARGE NOTE   Discharge instructions by: Risa Grill, PA-C  Verbalized understanding: yes  Skin care/Wound care healing? Incision with sutures, CDI  Pain: medicated at 0800  IV's: n/a  Tubes/Drains: n/a  O2: n/a  Safety instructions: given   Patient belongings: returned  Discharged to: home  Discharged via: care  Notes: Received meds from [facility], received equipment.  Dorthula Nettles, RN3, BSN, CBIS, CRRN, [place]

## 2022-04-15 NOTE — Progress Notes (Signed)
Inpatient Rehabilitation Discharge Medication Review by a Pharmacist  A complete drug regimen review was completed for this patient to identify any potential clinically significant medication issues.  High Risk Drug Classes Is patient taking? Indication by Medication  Antipsychotic No   Anticoagulant Yes Apixaban-VTE px  Antibiotic No   Opioid Yes Hydromorphone-pain  Antiplatelet No   Hypoglycemics/insulin No   Vasoactive Medication No   Chemotherapy No   Other Yes Folic acid-deficiency Methocarbamol-spasms Paroxetine-depression Miralax-constipation Pregabalin-pain Ergocalciferol-deficiency Senna-docusate-constiupation     Type of Medication Issue Identified Description of Issue Recommendation(s)  Drug Interaction(s) (clinically significant)     Duplicate Therapy     Allergy     No Medication Administration End Date     Incorrect Dose     Additional Drug Therapy Needed     Significant med changes from prior encounter (inform family/care partners about these prior to discharge).    Other       Clinically significant medication issues were identified that warrant physician communication and completion of prescribed/recommended actions by midnight of the next day:  No  Name of provider notified for urgent issues identified:   Provider Method of Notification:     Pharmacist comments:   Time spent performing this drug regimen review (minutes):  20   Braelynne Garinger A. Levada Dy, PharmD, BCPS, FNKF Clinical Pharmacist [place] Please utilize Amion for appropriate phone number to reach the unit pharmacist ([place])  04/14/2022 1:06 PM
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
## 2022-04-20 ENCOUNTER — Encounter (HOSPITAL_COMMUNITY): Payer: Self-pay | Admitting: Orthopedic Surgery

## 2022-04-20 DIAGNOSIS — E559 Vitamin D deficiency, unspecified: Secondary | ICD-10-CM

## 2022-04-20 DIAGNOSIS — S32811A Multiple fractures of pelvis with unstable disruption of pelvic ring, initial encounter for closed fracture: Secondary | ICD-10-CM

## 2022-04-20 HISTORY — DX: Vitamin D deficiency, unspecified: E55.9

## 2022-04-20 HISTORY — DX: Multiple fractures of pelvis with unstable disruption of pelvic ring, initial encounter for closed fracture: S32.811A
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
## 2022-04-21 ENCOUNTER — Telehealth: Payer: Self-pay | Admitting: Physical Medicine & Rehabilitation

## 2022-04-21 NOTE — Telephone Encounter (Signed)
I tried to reach patient three times. No answer not able to leave a message on her voicemail.

## 2022-04-21 NOTE — Telephone Encounter (Signed)
Patient is calling requesting something for pain  Please call patient.

## 2022-04-22 ENCOUNTER — Telehealth: Payer: Self-pay

## 2022-04-22 NOTE — Telephone Encounter (Signed)
Patient stated the MD sent in an Rx already and she is better.

## 2022-04-22 NOTE — Telephone Encounter (Signed)
Michele Hunt called with complaints of swelling in her back. That is concerning.    Patient has been advised to call her Surgeon  or PCP.

## 2022-04-27 ENCOUNTER — Encounter: Payer: Medicaid Other | Attending: Physical Medicine & Rehabilitation | Admitting: Physical Medicine & Rehabilitation

## 2022-04-27 ENCOUNTER — Telehealth: Payer: Self-pay

## 2022-04-27 ENCOUNTER — Encounter: Payer: Self-pay | Admitting: Physical Medicine & Rehabilitation

## 2022-04-27 DIAGNOSIS — F17211 Nicotine dependence, cigarettes, in remission: Secondary | ICD-10-CM | POA: Insufficient documentation

## 2022-04-27 DIAGNOSIS — Z79899 Other long term (current) drug therapy: Secondary | ICD-10-CM | POA: Insufficient documentation

## 2022-04-27 DIAGNOSIS — F0630 Mood disorder due to known physiological condition, unspecified: Secondary | ICD-10-CM | POA: Insufficient documentation

## 2022-04-27 DIAGNOSIS — M25552 Pain in left hip: Secondary | ICD-10-CM | POA: Diagnosis not present

## 2022-04-27 DIAGNOSIS — Z86718 Personal history of other venous thrombosis and embolism: Secondary | ICD-10-CM | POA: Diagnosis not present

## 2022-04-27 DIAGNOSIS — S329XXS Fracture of unspecified parts of lumbosacral spine and pelvis, sequela: Secondary | ICD-10-CM | POA: Insufficient documentation

## 2022-04-27 DIAGNOSIS — Z716 Tobacco abuse counseling: Secondary | ICD-10-CM | POA: Diagnosis not present

## 2022-04-27 DIAGNOSIS — M79652 Pain in left thigh: Secondary | ICD-10-CM | POA: Diagnosis not present

## 2022-04-27 DIAGNOSIS — M549 Dorsalgia, unspecified: Secondary | ICD-10-CM | POA: Insufficient documentation

## 2022-04-27 IMAGING — CR DG CHEST 2V
2 series · 2 of 2 positions shown · non-contrast
Comparison: Chest radiograph dated 09/03/2017

CLINICAL DATA: Cough and wheezing.

EXAM:
CHEST - 2 VIEW

[chest lat]
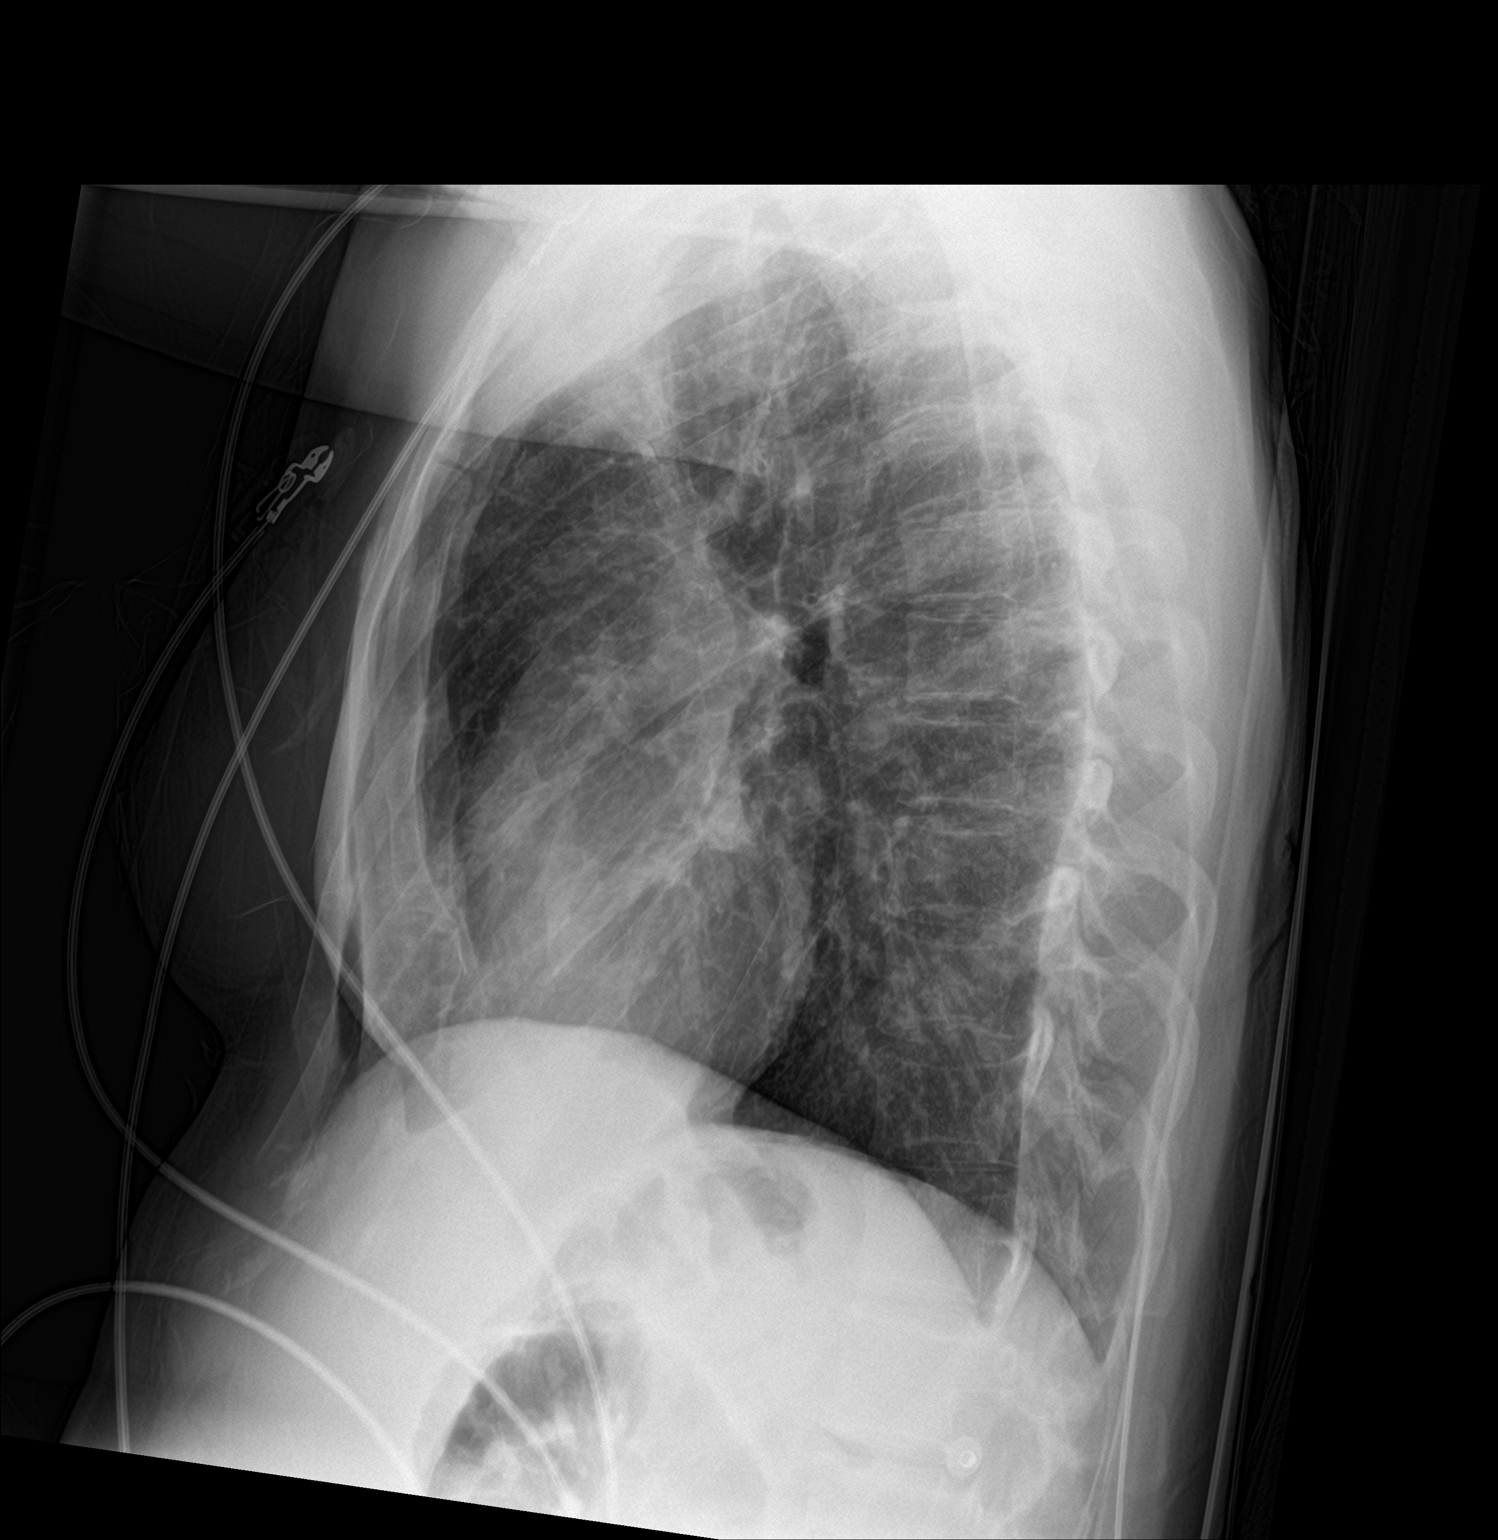

[chest ap]
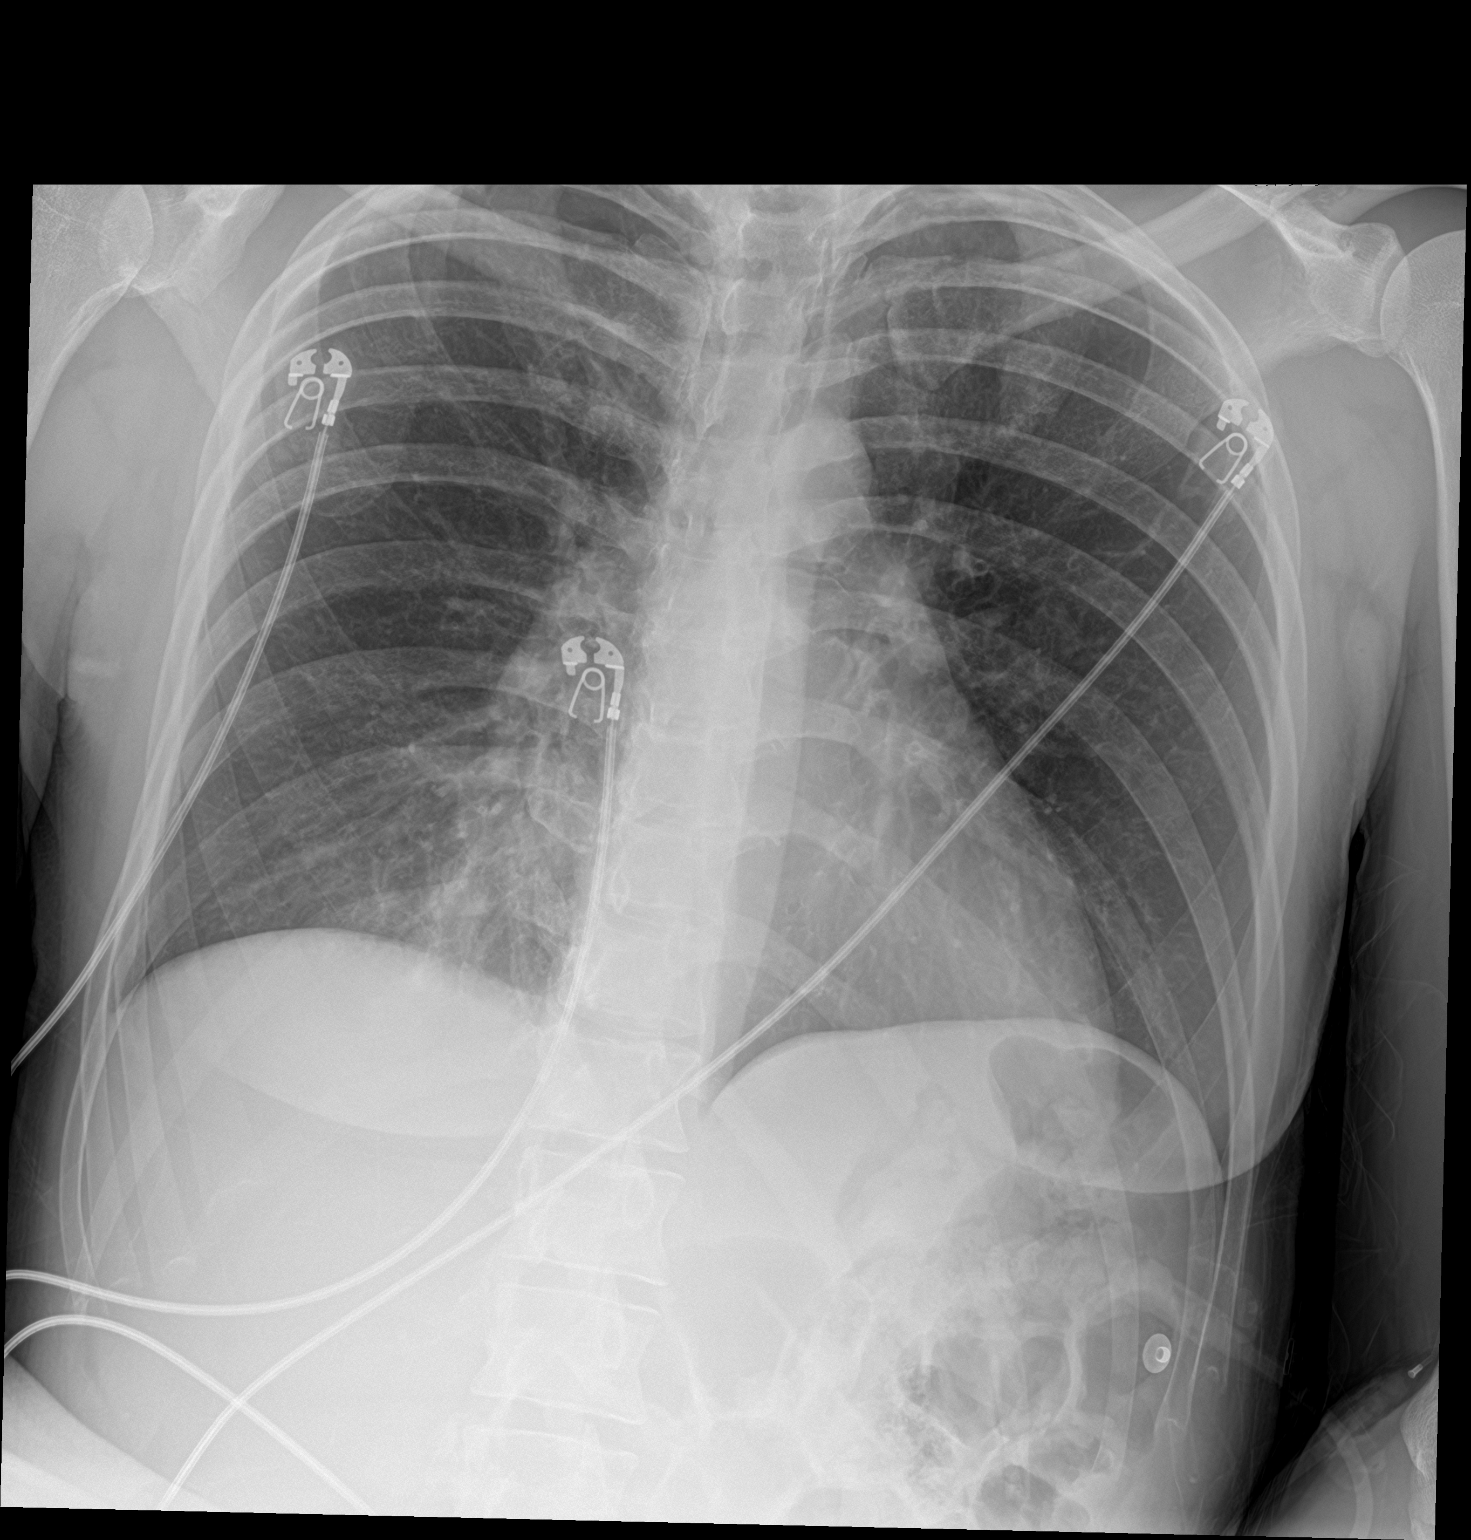

[2 of 2 positions shown; findings below may reference images not displayed]

FINDINGS: Faint interstitial densities at the right lung base may be chronic
or atelectasis. Developing infiltrate is less likely but not
excluded clinical correlation is recommended. No focal
consolidation, pleural effusion, pneumothorax. The cardiac
silhouette is within normal limits. No acute osseous pathology.
IMPRESSION: Chronic changes at the right lung base. No focal consolidation.

## 2022-04-27 MED ORDER — NALOXONE HCL 4 MG/0.1ML NA LIQD: NASAL | 0 refills | Status: DC

## 2022-04-27 MED ORDER — OXYCODONE-ACETAMINOPHEN 7.5-325 MG PO TABS: 1.0000 | ORAL_TABLET | Freq: Four times a day (QID) | ORAL | 0 refills | Status: AC | PRN

## 2022-04-27 MED ORDER — HYDROMORPHONE HCL 4 MG PO TABS: 4.0000 mg | ORAL_TABLET | Freq: Four times a day (QID) | ORAL | 0 refills | Status: DC | PRN

## 2022-04-27 NOTE — Telephone Encounter (Signed)
Michele Hunt was seen today and her pain medication was sent to the pharmacy. But it can not be released until Wednesday. Patient stated she is in pain and need it today. Is there anything you can do?  Call back phone [phone].
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
## 2022-05-01 ENCOUNTER — Telehealth: Payer: Self-pay

## 2022-05-01 NOTE — Telephone Encounter (Signed)
Spoke w/ patient to advise that she needs to contact her surgeon office to see what they can do about the medication and also advised they will be managing her pain going forward.
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
## 2022-05-29 ENCOUNTER — Encounter
Payer: No Typology Code available for payment source | Attending: Physical Medicine & Rehabilitation | Admitting: Physical Medicine & Rehabilitation

## 2022-05-29 ENCOUNTER — Encounter: Payer: Self-pay | Admitting: Physical Medicine & Rehabilitation

## 2022-05-29 VITALS — BP 126/82 | HR 84 | Ht 67.0 in | Wt 152.6 lb

## 2022-05-29 DIAGNOSIS — S329XXS Fracture of unspecified parts of lumbosacral spine and pelvis, sequela: Secondary | ICD-10-CM | POA: Diagnosis not present

## 2022-05-29 DIAGNOSIS — M545 Low back pain, unspecified: Secondary | ICD-10-CM | POA: Diagnosis not present

## 2022-05-29 DIAGNOSIS — X58XXXS Exposure to other specified factors, sequela: Secondary | ICD-10-CM | POA: Insufficient documentation

## 2022-05-29 DIAGNOSIS — Z86718 Personal history of other venous thrombosis and embolism: Secondary | ICD-10-CM | POA: Insufficient documentation

## 2022-05-29 DIAGNOSIS — Z79899 Other long term (current) drug therapy: Secondary | ICD-10-CM | POA: Insufficient documentation

## 2022-05-29 DIAGNOSIS — F0630 Mood disorder due to known physiological condition, unspecified: Secondary | ICD-10-CM | POA: Diagnosis present

## 2022-05-29 MED ORDER — DULOXETINE HCL 30 MG PO CPEP: 30.0000 mg | ORAL_CAPSULE | Freq: Every day | ORAL | 3 refills | Status: DC
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
## 2022-06-22 NOTE — L&D Delivery Note (Signed)
Delivery Note  Michele Hunt is a G1P1001 at [redacted]w[redacted]d with an LMP of 04/01/22, consistent with [place] at [redacted]w[redacted]d.   First Stage: Labor onset: 1140 02/06/23 Augmentation: oxytocin Analgesia Eliezer Lofts intrapartum: epidural SROM at 1140 on 02/06/23 GBS: negative IP Antibiotics: none  Second Stage: Complete dilation at 0101 Onset of pushing at 0107 FHR second stage 145 bpm with moderate variability, variable decels with pushing   Michele Hunt presented to L&D with SROM. She was 2/80/-2. She progressed  to C/C/+2 with a spontaneous urge to push.  She pushed  effectively over approximately 88 minutes for a spontaneous vaginal birth.  Delivery of a viable baby girl on 02/07/23 at 0235 by CNM Delivery of fetal head in OA position with restitution to LOT. no nuchal cord;  Anterior then posterior shoulders delivered easily with gentle downward traction. Baby placed on mom's chest, and attended to by baby RN Cord double clamped after cessation of pulsation, cut by CNM  Cord blood sample collection: Not Indicated B POS Collection of cord blood donation n/a Arterial cord blood sample no  Third Stage: Oxytocin bolus started after delivery of infant for hemorrhage prophylaxis  Placenta delivered schultz intact with 3 VC @ 0238 Placenta disposition: discarded per protocol Uterine tone firm / bleeding moderate  Bilateral labial laceration identified  Anesthesia for repair: epidural Repair 3-0 vicryl Est. Blood Loss (mL): 275  Complications: none  Mom to postpartum.  Baby to Couplet care / Skin to Skin.  Newborn: Information for the patient's newborn:  Raely, Hibbert [[id number]]  Live born female Lanora Manis "Presley" Birth Weight: 7 lb 3.3 oz (3270 g) APGAR: 7, 8  Newborn Delivery   Birth date/time: 02/07/2023 02:35:00 Delivery type: Vaginal, Spontaneous      Feeding planned: breast feeding  ---------- Chari Manning, CNM Certified Nurse Midwife [facility]

## 2022-06-24 DIAGNOSIS — S32811D Multiple fractures of pelvis with unstable disruption of pelvic ring, subsequent encounter for fracture with routine healing: Secondary | ICD-10-CM | POA: Diagnosis not present

## 2022-06-24 DIAGNOSIS — S72352D Displaced comminuted fracture of shaft of left femur, subsequent encounter for closed fracture with routine healing: Secondary | ICD-10-CM | POA: Diagnosis not present

## 2022-07-10 ENCOUNTER — Ambulatory Visit (LOCAL_COMMUNITY_HEALTH_CENTER): Payer: Self-pay

## 2022-07-10 VITALS — BP 138/83 | Ht 67.0 in | Wt 152.0 lb

## 2022-07-10 DIAGNOSIS — Z3201 Encounter for pregnancy test, result positive: Secondary | ICD-10-CM

## 2022-07-10 LAB — PREGNANCY, URINE: Preg Test, Ur: POSITIVE — AB

## 2022-07-10 MED ORDER — PRENATAL 27-0.8 MG PO TABS: 1.0000 | ORAL_TABLET | Freq: Every day | ORAL | 0 refills | Status: AC

## 2022-07-10 NOTE — Progress Notes (Signed)
UPT positive. Unsure where she plans prenatal care, but considering [facility]. Sent to [facility] for medicaid/preg women.   The patient was dispensed prenatal vitamins #100 today. I provided counseling today regarding the medication. We discussed the medication, the side effects and when to call clinic. Patient given the opportunity to ask questions. Questions answered.  Josie Saunders, RN

## 2022-07-16 ENCOUNTER — Other Ambulatory Visit: Payer: Self-pay

## 2022-07-16 ENCOUNTER — Emergency Department: Payer: 59

## 2022-07-16 ENCOUNTER — Encounter: Payer: Self-pay | Admitting: *Deleted

## 2022-07-16 ENCOUNTER — Emergency Department
Admission: EM | Admit: 2022-07-16 | Discharge: 2022-07-17 | Disposition: A | Discharge status: Home / Self Care | Payer: 59 | Attending: Emergency Medicine | Admitting: Emergency Medicine

## 2022-07-16 DIAGNOSIS — Z3A08 8 weeks gestation of pregnancy: Secondary | ICD-10-CM | POA: Diagnosis not present

## 2022-07-16 DIAGNOSIS — O26891 Other specified pregnancy related conditions, first trimester: Secondary | ICD-10-CM | POA: Insufficient documentation

## 2022-07-16 DIAGNOSIS — R102 Pelvic and perineal pain: Secondary | ICD-10-CM | POA: Diagnosis not present

## 2022-07-16 DIAGNOSIS — O26899 Other specified pregnancy related conditions, unspecified trimester: Secondary | ICD-10-CM

## 2022-07-16 LAB — COMPREHENSIVE METABOLIC PANEL
ALT: 10 U/L (ref 0–44)
AST: 15 U/L (ref 15–41)
Albumin: 4.1 g/dL (ref 3.5–5.0)
Alkaline Phosphatase: 73 U/L (ref 38–126)
Anion gap: 10 (ref 5–15)
BUN: 14 mg/dL (ref 6–20)
CO2: 21 mmol/L — ABNORMAL LOW (ref 22–32)
Calcium: 9.1 mg/dL (ref 8.9–10.3)
Chloride: 104 mmol/L (ref 98–111)
Creatinine, Ser: 0.48 mg/dL (ref 0.44–1.00)
GFR, Estimated: >60 mL/min (ref >60)
Glucose, Bld: 100 mg/dL — ABNORMAL HIGH (ref 70–99)
Potassium: 3.6 mmol/L (ref 3.5–5.1)
Sodium: 135 mmol/L (ref 135–145)
Total Bilirubin: 0.5 mg/dL (ref 0.3–1.2)
Total Protein: 7 g/dL (ref 6.5–8.1)

## 2022-07-16 LAB — CBC
HCT: 35.4 % — ABNORMAL LOW (ref 36.0–46.0)
Hemoglobin: 12.1 g/dL (ref 12.0–15.0)
MCH: 30.3 pg (ref 26.0–34.0)
MCHC: 34.2 g/dL (ref 30.0–36.0)
MCV: 88.7 fL (ref 80.0–100.0)
Platelets: 181 10*3/uL (ref 150–400)
RBC: 3.99 MIL/uL (ref 3.87–5.11)
RDW: 13.3 % (ref 11.5–15.5)
WBC: 10.7 10*3/uL — ABNORMAL HIGH (ref 4.0–10.5)
nRBC: 0 % (ref 0.0–0.2)

## 2022-07-16 LAB — URINALYSIS, ROUTINE W REFLEX MICROSCOPIC
Bilirubin Urine: NEGATIVE
Glucose, UA: NEGATIVE mg/dL
Hgb urine dipstick: NEGATIVE
Ketones, ur: NEGATIVE mg/dL
Leukocytes,Ua: NEGATIVE
Nitrite: NEGATIVE
Protein, ur: NEGATIVE mg/dL
Specific Gravity, Urine: 1.027 (ref 1.005–1.030)
pH: 5 (ref 5.0–8.0)

## 2022-07-16 LAB — LIPASE, BLOOD: Lipase: 44 U/L (ref 11–51)

## 2022-07-16 LAB — HCG, QUANTITATIVE, PREGNANCY: hCG, Beta Chain, Quant, S: 158983 m[IU]/mL — ABNORMAL HIGH (ref <5)

## 2022-07-16 NOTE — ED Triage Notes (Addendum)
Pt has abd pain.  Pt reports she is approx [redacted] weeks pregnant.   No vag bleeding.   Treated at health dept.  Pt states pain started yesterday.  No urinary sx.  Pt alert  speech clear.

## 2022-07-17 LAB — WET PREP, GENITAL
Clue Cells Wet Prep HPF POC: NONE SEEN
Sperm: NONE SEEN
Trich, Wet Prep: NONE SEEN
WBC, Wet Prep HPF POC: <10 (ref <10)
Yeast Wet Prep HPF POC: NONE SEEN

## 2022-07-17 LAB — CHLAMYDIA/NGC RT PCR (ARMC ONLY)
Chlamydia Tr: NOT DETECTED
N gonorrhoeae: NOT DETECTED

## 2022-07-17 NOTE — Discharge Instructions (Addendum)
Your labs, urine, ultrasound were reassuring today.  Your ultrasound measures that you are 8 weeks, 1 day pregnant today with an estimated due date of 02/24/2023.  You may take Tylenol 1000 mg every 6 hours as needed for pain.  Please avoid NSAIDs such as aspirin, ibuprofen, Aleve.  I recommend that you begin taking a prenatal vitamin over-the-counter daily.
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
## 2022-07-22 DIAGNOSIS — S32811D Multiple fractures of pelvis with unstable disruption of pelvic ring, subsequent encounter for fracture with routine healing: Secondary | ICD-10-CM | POA: Diagnosis not present

## 2022-07-22 DIAGNOSIS — S72352D Displaced comminuted fracture of shaft of left femur, subsequent encounter for closed fracture with routine healing: Secondary | ICD-10-CM | POA: Diagnosis not present

## 2022-07-27 ENCOUNTER — Ambulatory Visit: Payer: 59 | Admitting: Advanced Practice Midwife

## 2022-07-27 ENCOUNTER — Encounter: Payer: Self-pay | Admitting: Advanced Practice Midwife

## 2022-07-27 VITALS — BP 111/70 | HR 85 | Temp 97.1°F | Wt 153.2 lb

## 2022-07-27 DIAGNOSIS — F419 Anxiety disorder, unspecified: Secondary | ICD-10-CM

## 2022-07-27 DIAGNOSIS — S32811A Multiple fractures of pelvis with unstable disruption of pelvic ring, initial encounter for closed fracture: Secondary | ICD-10-CM

## 2022-07-27 DIAGNOSIS — T40411A Poisoning by fentanyl or fentanyl analogs, accidental (unintentional), initial encounter: Secondary | ICD-10-CM

## 2022-07-27 DIAGNOSIS — Z9889 Other specified postprocedural states: Secondary | ICD-10-CM | POA: Insufficient documentation

## 2022-07-27 DIAGNOSIS — T7411XS Adult physical abuse, confirmed, sequela: Secondary | ICD-10-CM

## 2022-07-27 DIAGNOSIS — O0991 Supervision of high risk pregnancy, unspecified, first trimester: Secondary | ICD-10-CM | POA: Diagnosis not present

## 2022-07-27 DIAGNOSIS — O0992 Supervision of high risk pregnancy, unspecified, second trimester: Secondary | ICD-10-CM | POA: Diagnosis not present

## 2022-07-27 DIAGNOSIS — T7411XA Adult physical abuse, confirmed, initial encounter: Secondary | ICD-10-CM | POA: Insufficient documentation

## 2022-07-27 LAB — WET PREP FOR TRICH, YEAST, CLUE
Trichomonas Exam: NEGATIVE
Yeast Exam: NEGATIVE

## 2022-07-27 LAB — HEMOGLOBIN, FINGERSTICK: Hemoglobin: 12.1 g/dL (ref 11.1–15.9)
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
## 2022-07-27 NOTE — Progress Notes (Signed)
Patient declined flu vaccine. Patient maiden name was [name]. Per [facility] patient is Michele Hunt and patient declined change to last name [name], as she is planning to change her name back to [name] in the near future.Lenard Forth mount reviewed, no treatment indicated. Hgb= 12.1 today. Dental list and Vickey Huger card given. [facility] U/S referral faxed with confirmation.Marland KitchenMarland KitchenJenetta Downer, RN
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
## 2022-07-28 LAB — 789231 7+OXYCODONE-BUND
Amphetamines, Urine: NEGATIVE ng/mL
BENZODIAZ UR QL: NEGATIVE ng/mL
Barbiturate screen, urine: NEGATIVE ng/mL
Cannabinoid Quant, Ur: NEGATIVE ng/mL
Cocaine (Metab.): NEGATIVE ng/mL
OPIATE SCREEN URINE: NEGATIVE ng/mL
Oxycodone/Oxymorphone, Urine: NEGATIVE ng/mL
PCP Quant, Ur: NEGATIVE ng/mL

## 2022-07-29 ENCOUNTER — Telehealth: Payer: Self-pay

## 2022-07-29 NOTE — Telephone Encounter (Signed)
Call to patient - no answer LVM.   LVM regarding ultrasound and genetics counseling appointment has been scheduled for [facility] on 08/20/22 for 09:00am. Informed patient to return call at [phone].   Al Decant, RN

## 2022-07-30 LAB — IGP, APTIMA HPV
HPV Aptima: NEGATIVE
PAP Smear Comment: 0

## 2022-07-30 NOTE — Telephone Encounter (Signed)
TC received from clerical stating patient called and wanted to know why her U/S was cancelled after looking in [place]. U/S and GC are scheduled for 08/20/2022 at 9:00 and 10:00. Patient needs to arrive 8:45. Patient appointment has not been canceled.   TC to patient to make sure she is aware of location, date and time. LM with number to call.Jenetta Downer, RN

## 2022-07-31 LAB — PREGNANCY, INITIAL SCREEN
Antibody Screen: NEGATIVE
Basophils Absolute: 0 10*3/uL (ref 0.0–0.2)
Basos: 0 %
Bilirubin, UA: NEGATIVE
Chlamydia trachomatis, NAA: NEGATIVE
EOS (ABSOLUTE): 0.3 10*3/uL (ref 0.0–0.4)
Eos: 3 %
Glucose, UA: NEGATIVE
HCV Ab: NONREACTIVE
HIV Screen 4th Generation wRfx: NONREACTIVE
Hematocrit: 33.7 % — ABNORMAL LOW (ref 34.0–46.6)
Hemoglobin: 11.8 g/dL (ref 11.1–15.9)
Hepatitis B Surface Ag: NEGATIVE
Immature Grans (Abs): 0 10*3/uL (ref 0.0–0.1)
Immature Granulocytes: 0 %
Ketones, UA: NEGATIVE
Leukocytes,UA: NEGATIVE
Lymphocytes Absolute: 1.8 10*3/uL (ref 0.7–3.1)
Lymphs: 20 %
MCH: 30.6 pg (ref 26.6–33.0)
MCHC: 35 g/dL (ref 31.5–35.7)
MCV: 88 fL (ref 79–97)
Monocytes Absolute: 0.7 10*3/uL (ref 0.1–0.9)
Monocytes: 8 %
Neisseria Gonorrhoeae by PCR: NEGATIVE
Neutrophils Absolute: 5.9 10*3/uL (ref 1.4–7.0)
Neutrophils: 69 %
Nitrite, UA: NEGATIVE
Platelets: 209 10*3/uL (ref 150–450)
Protein,UA: NEGATIVE
RBC, UA: NEGATIVE
RBC: 3.85 x10E6/uL (ref 3.77–5.28)
RDW: 13.9 % (ref 11.7–15.4)
RPR Ser Ql: NONREACTIVE
Rh Factor: POSITIVE
Rubella Antibodies, IGG: 3.72 index (ref >0.99)
Specific Gravity, UA: 1.024 (ref 1.005–1.030)
Urobilinogen, Ur: 0.2 mg/dL (ref 0.2–1.0)
WBC: 8.7 10*3/uL (ref 3.4–10.8)
pH, UA: 5.5 (ref 5.0–7.5)

## 2022-07-31 LAB — MICROSCOPIC EXAMINATION
Bacteria, UA: NONE SEEN
Casts: NONE SEEN /lpf
RBC, Urine: NONE SEEN /hpf (ref 0–2)
WBC, UA: NONE SEEN /hpf (ref 0–5)

## 2022-07-31 LAB — URINE CULTURE, OB REFLEX

## 2022-07-31 LAB — MATERNIT 21 PLUS CORE, BLOOD
Fetal Fraction: 16
Result (T21): NEGATIVE
Trisomy 13 (Patau syndrome): NEGATIVE
Trisomy 18 (Edwards syndrome): NEGATIVE
Trisomy 21 (Down syndrome): NEGATIVE

## 2022-07-31 LAB — VARICELLA ZOSTER ANTIBODY, IGG: Varicella zoster IgG: 1901 index (ref >165)

## 2022-07-31 LAB — HCV INTERPRETATION

## 2022-07-31 NOTE — Telephone Encounter (Signed)
Call to client and notified her that per [place] appt desk, her 08/20/2022 consult / [place] appts have not been cancelled and that she should arrive at [place] for appt. Rich Number, RN

## 2022-08-07 ENCOUNTER — Encounter: Payer: Self-pay | Attending: Physical Medicine & Rehabilitation | Admitting: Physical Medicine & Rehabilitation

## 2022-08-07 DIAGNOSIS — S329XXS Fracture of unspecified parts of lumbosacral spine and pelvis, sequela: Secondary | ICD-10-CM | POA: Insufficient documentation

## 2022-08-07 DIAGNOSIS — Z79899 Other long term (current) drug therapy: Secondary | ICD-10-CM | POA: Insufficient documentation

## 2022-08-07 DIAGNOSIS — Z86718 Personal history of other venous thrombosis and embolism: Secondary | ICD-10-CM | POA: Insufficient documentation

## 2022-08-07 DIAGNOSIS — M545 Low back pain, unspecified: Secondary | ICD-10-CM | POA: Insufficient documentation

## 2022-08-07 DIAGNOSIS — F0630 Mood disorder due to known physiological condition, unspecified: Secondary | ICD-10-CM | POA: Insufficient documentation

## 2022-08-07 NOTE — Progress Notes (Deleted)
Subjective:    Patient ID: Michele Hunt, female    DOB: 1995/09/16, 27 y.o.   MRN: [id number]  HPI Pain Inventory Average Pain {NUMBERS; 0-10:5044} Pain Right Now {NUMBERS; 0-10:5044} My pain is {PAIN DESCRIPTION:21022940}  In the last 24 hours, has pain interfered with the following? General activity {NUMBERS; 0-10:5044} Relation with others {NUMBERS; 0-10:5044} Enjoyment of life {NUMBERS; 0-10:5044} What TIME of day is your pain at its worst? {time of day:24191} Sleep (in general) {BHH GOOD/FAIR/POOR:22877}  Pain is worse with: {ACTIVITIES:21022942} Pain improves with: {PAIN IMPROVES SV:5789238 Relief from Meds: {NUMBERS; 0-10:5044}  Family History  Problem Relation Age of Onset   Heart Problems Mother    Hodgkin's lymphoma Mother    Hypertension Father    Diverticulitis Maternal Grandmother    Heart disease Paternal Grandfather    Lung disease Paternal Grandfather    Breast cancer Neg Hx    Social History   Socioeconomic History   Marital status: Divorced    Spouse name: Not on file   Number of children: 0   Years of education: 12   Highest education level: High school graduate  Occupational History   Occupation: Cosmetologist   Tobacco Use   Smoking status: Former    Packs/day: 0.75    Types: Cigarettes    Quit date: 06/22/2022    Years since quitting: 0.1    Passive exposure: Never   Smokeless tobacco: Never  Vaping Use   Vaping Use: Never used  Substance and Sexual Activity   Alcohol use: Not Currently    Comment: last use 06/22/22- "wine or beer" 3 glasses   Drug use: Never   Sexual activity: Yes    Partners: Male    Birth control/protection: Condom, Injection, Pill  Other Topics Concern   Not on file  Social History Narrative   Lives with boyfriend, FOB   Social Determinants of Health   Financial Resource Strain: Low Risk  (07/27/2022)   Overall Financial Resource Strain (CARDIA)    Difficulty of Paying Living Expenses: Not very hard  Food  Insecurity: No Food Insecurity (07/27/2022)   Hunger Vital Sign    Worried About Running Out of Food in the Last Year: Never true    Ran Out of Food in the Last Year: Never true  Transportation Needs: No Transportation Needs (07/27/2022)   PRAPARE - Hydrologist (Medical): No    Lack of Transportation (Non-Medical): No  Physical Activity: Not on file  Stress: Not on file  Social Connections: Not on file   Past Surgical History:  Procedure Laterality Date   Cervical Lymph Node Removed     Neck   FEMUR IM NAIL Left 04/02/2022   Procedure: INTRAMEDULLARY (IM) NAIL FEMORAL;  Surgeon: Altamese Geneva, MD;  Location: [facility];  Service: Orthopedics;  Laterality: Left;   ORIF PELVIC FRACTURE Left 04/02/2022   Procedure: OPEN REDUCTION INTERNAL FIXATION (ORIF) PELVIC FRACTURE;  Surgeon: Altamese Severn, MD;  Location: [facility] [facility];  Service: Orthopedics;  Laterality: Left;   Stye Removed     WISDOM TOOTH EXTRACTION  06/23/2011   Past Surgical History:  Procedure Laterality Date   Cervical Lymph Node Removed     Neck   FEMUR IM NAIL Left 04/02/2022   Procedure: INTRAMEDULLARY (IM) NAIL FEMORAL;  Surgeon: Altamese Silesia, MD;  Location: [place];  Service: Orthopedics;  Laterality: Left;   ORIF PELVIC FRACTURE Left 04/02/2022   Procedure: OPEN REDUCTION INTERNAL FIXATION (ORIF) PELVIC FRACTURE;  Surgeon:  Altamese Fernville, MD;  Location: [facility];  Service: Orthopedics;  Laterality: Left;   Stye Removed     WISDOM TOOTH EXTRACTION  06/23/2011   Past Medical History:  Diagnosis Date   Anxiety    Depression    History of chicken pox 06/23/1995   Multiple fractures of pelvis with unstable disruption of pelvic ring, initial encounter for closed fracture ([place]) 04/20/2022   Vaginal Pap smear, abnormal    Vitamin D deficiency 04/20/2022   LMP 04/01/2022 (Approximate) Comment: 05/22/2022- light bleeding/spotting x 2 days  Opioid Risk Score:   Fall Risk Score:  `1  Depression screen PHQ  2/9     07/27/2022    2:13 PM 07/10/2022    2:17 PM 07/10/2022    2:10 PM 04/27/2022   10:12 AM  Depression screen PHQ 2/9  Decreased Interest 1 0 0 1  Down, Depressed, Hopeless 0 0 0 0  PHQ - 2 Score 1 0 0 1  Altered sleeping 0   2  Tired, decreased energy 1   1  Change in appetite 0   0  Feeling bad or failure about yourself  0   0  Trouble concentrating 0   0  Moving slowly or fidgety/restless 0   0  Suicidal thoughts 0   0  PHQ-9 Score 2   4  Difficult doing work/chores    Somewhat difficult      Review of Systems     Objective:   Physical Exam        Assessment & Plan:

## 2022-08-20 ENCOUNTER — Ambulatory Visit: Payer: 59

## 2022-08-20 ENCOUNTER — Other Ambulatory Visit: Payer: Self-pay

## 2022-08-20 ENCOUNTER — Ambulatory Visit (HOSPITAL_BASED_OUTPATIENT_CLINIC_OR_DEPARTMENT_OTHER): Payer: 59 | Admitting: Obstetrics

## 2022-08-20 ENCOUNTER — Ambulatory Visit: Payer: 59 | Attending: Obstetrics

## 2022-08-20 DIAGNOSIS — Z8781 Personal history of (healed) traumatic fracture: Secondary | ICD-10-CM

## 2022-08-20 DIAGNOSIS — O9A211 Injury, poisoning and certain other consequences of external causes complicating pregnancy, first trimester: Secondary | ICD-10-CM | POA: Diagnosis not present

## 2022-08-20 DIAGNOSIS — Z3A13 13 weeks gestation of pregnancy: Secondary | ICD-10-CM

## 2022-08-20 DIAGNOSIS — O2690 Pregnancy related conditions, unspecified, unspecified trimester: Secondary | ICD-10-CM | POA: Diagnosis not present

## 2022-08-20 DIAGNOSIS — O0991 Supervision of high risk pregnancy, unspecified, first trimester: Secondary | ICD-10-CM
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
## 2022-08-24 ENCOUNTER — Telehealth: Payer: Self-pay

## 2022-08-24 ENCOUNTER — Ambulatory Visit: Payer: 59
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
## 2022-08-31 NOTE — Telephone Encounter (Signed)
Call to client to reschedule missed [facility] RV appt. Left message to call with number provided. Rich Number, RN
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
## 2022-09-01 NOTE — Addendum Note (Signed)
Addended by: Cletis Media on: 09/01/2022 08:26 AM   Modules accepted: Orders

## 2022-09-02 NOTE — Telephone Encounter (Signed)
Patient called clerical and scheduled appointment for 09/09/22 at 1:20 arrival time.   Al Decant, RN

## 2022-09-09 ENCOUNTER — Ambulatory Visit: Payer: 59 | Admitting: Advanced Practice Midwife

## 2022-09-09 VITALS — BP 114/70 | HR 92 | Temp 96.8°F | Wt 159.4 lb

## 2022-09-09 DIAGNOSIS — O0992 Supervision of high risk pregnancy, unspecified, second trimester: Secondary | ICD-10-CM

## 2022-09-09 DIAGNOSIS — F419 Anxiety disorder, unspecified: Secondary | ICD-10-CM

## 2022-09-09 DIAGNOSIS — N871 Moderate cervical dysplasia: Secondary | ICD-10-CM

## 2022-09-09 DIAGNOSIS — Z9889 Other specified postprocedural states: Secondary | ICD-10-CM

## 2022-09-09 DIAGNOSIS — S32811A Multiple fractures of pelvis with unstable disruption of pelvic ring, initial encounter for closed fracture: Secondary | ICD-10-CM

## 2022-09-09 DIAGNOSIS — O0991 Supervision of high risk pregnancy, unspecified, first trimester: Secondary | ICD-10-CM

## 2022-09-09 NOTE — Progress Notes (Signed)
AFP undecided, may decide to perform at next visit. Information given. BTHIELE RN
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
## 2022-09-28 ENCOUNTER — Other Ambulatory Visit: Payer: Self-pay

## 2022-09-28 DIAGNOSIS — Z8781 Personal history of (healed) traumatic fracture: Secondary | ICD-10-CM

## 2022-09-28 DIAGNOSIS — O9A211 Injury, poisoning and certain other consequences of external causes complicating pregnancy, first trimester: Secondary | ICD-10-CM

## 2022-09-30 ENCOUNTER — Ambulatory Visit: Payer: 59 | Attending: Maternal & Fetal Medicine

## 2022-09-30 ENCOUNTER — Other Ambulatory Visit: Payer: Self-pay

## 2022-09-30 DIAGNOSIS — O99322 Drug use complicating pregnancy, second trimester: Secondary | ICD-10-CM | POA: Insufficient documentation

## 2022-09-30 DIAGNOSIS — O9A211 Injury, poisoning and certain other consequences of external causes complicating pregnancy, first trimester: Secondary | ICD-10-CM

## 2022-09-30 DIAGNOSIS — O9A212 Injury, poisoning and certain other consequences of external causes complicating pregnancy, second trimester: Secondary | ICD-10-CM | POA: Diagnosis not present

## 2022-09-30 DIAGNOSIS — O4442 Low lying placenta NOS or without hemorrhage, second trimester: Secondary | ICD-10-CM | POA: Diagnosis not present

## 2022-09-30 DIAGNOSIS — Z3A19 19 weeks gestation of pregnancy: Secondary | ICD-10-CM | POA: Insufficient documentation

## 2022-09-30 DIAGNOSIS — O99312 Alcohol use complicating pregnancy, second trimester: Secondary | ICD-10-CM | POA: Diagnosis not present

## 2022-09-30 DIAGNOSIS — X58XXXA Exposure to other specified factors, initial encounter: Secondary | ICD-10-CM | POA: Diagnosis not present

## 2022-09-30 DIAGNOSIS — S329XXA Fracture of unspecified parts of lumbosacral spine and pelvis, initial encounter for closed fracture: Secondary | ICD-10-CM | POA: Insufficient documentation

## 2022-09-30 DIAGNOSIS — Z8781 Personal history of (healed) traumatic fracture: Secondary | ICD-10-CM | POA: Diagnosis not present

## 2022-09-30 DIAGNOSIS — O2692 Pregnancy related conditions, unspecified, second trimester: Secondary | ICD-10-CM | POA: Insufficient documentation

## 2022-10-07 ENCOUNTER — Ambulatory Visit: Payer: 59

## 2022-10-07 ENCOUNTER — Telehealth: Payer: Self-pay

## 2022-10-07 NOTE — Telephone Encounter (Signed)
Patient did not arrive to Prenatal appointment today. Called to reschedule. Left message on voice mail with [facility] phone number so she could reschedule. BTHIELE RN

## 2022-10-12 NOTE — Telephone Encounter (Signed)
Call to client to reschedule recently missed [facility] appt. Left message to call with number provided. .Dorian Renfro, RN ° °

## 2022-10-14 NOTE — Telephone Encounter (Signed)
Call to patient to reschedule missed [facility] RV appt on 10/07/22.  Left message to call with number provided.   Earlyne Iba, RN

## 2022-10-15 NOTE — Telephone Encounter (Signed)
Call to client to reschedule recently missed [facility] RV appt. Left message to call with number provided. Call to emergency contact (aunt) and requested assistance contacting client to schedule [facility] appt. Number to call provided. Client's aunt states she will send her a message and request she schedule appt. Jossie Ng, RN

## 2022-10-19 ENCOUNTER — Telehealth: Payer: Self-pay

## 2022-10-19 NOTE — Telephone Encounter (Signed)
Left message in patients voice mail to call [facility] at [phone] to schedule return visit. BTHIELE RN

## 2022-10-19 NOTE — Telephone Encounter (Signed)
Left message to schedule  AC Follow Up. NS 10/07/2022

## 2022-10-20 ENCOUNTER — Telehealth: Payer: Self-pay | Admitting: Family Medicine

## 2022-10-20 NOTE — Telephone Encounter (Signed)
Returned patient phone call. Patient is 21.[redacted] weeks pregnant and is concerned with vaginal discharge. Patient states she is having a "creamy, white, milky vaginal discharge." States "it feels like its pouring out." Patient denies abdominal cramping, vaginal itching or bleeding, odor or watery discharge. Reviewed with patient to keep tomorrow's MH RV appt to evaluate above symptoms and that if she begins to experience new symptoms such as vaginal bleeding with abdominal cramping or watery discharge before scheduled RV appt to go to the local ED for evaluation. Patient verbalized understanding of above. Tawny Hopping, RN

## 2022-10-21 ENCOUNTER — Ambulatory Visit: Payer: 59 | Admitting: Advanced Practice Midwife

## 2022-10-21 VITALS — BP 118/72 | HR 90 | Temp 97.7°F | Wt 159.4 lb

## 2022-10-21 DIAGNOSIS — F419 Anxiety disorder, unspecified: Secondary | ICD-10-CM | POA: Diagnosis not present

## 2022-10-21 DIAGNOSIS — O0991 Supervision of high risk pregnancy, unspecified, first trimester: Secondary | ICD-10-CM

## 2022-10-21 DIAGNOSIS — Z9889 Other specified postprocedural states: Secondary | ICD-10-CM

## 2022-10-21 DIAGNOSIS — Z91199 Patient's noncompliance with other medical treatment and regimen due to unspecified reason: Secondary | ICD-10-CM | POA: Diagnosis not present

## 2022-10-21 DIAGNOSIS — O0992 Supervision of high risk pregnancy, unspecified, second trimester: Secondary | ICD-10-CM | POA: Diagnosis not present

## 2022-10-21 DIAGNOSIS — Z720 Tobacco use: Secondary | ICD-10-CM | POA: Diagnosis not present

## 2022-10-21 DIAGNOSIS — T40411A Poisoning by fentanyl or fentanyl analogs, accidental (unintentional), initial encounter: Secondary | ICD-10-CM
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
## 2022-10-21 NOTE — Progress Notes (Addendum)
Completed PHQ 9 and CCNC Form. Declines AFP screening. Undecided BCM and Pediatrician. BTHIELE RN

## 2022-11-27 ENCOUNTER — Other Ambulatory Visit: Payer: Self-pay | Admitting: Maternal & Fetal Medicine

## 2022-11-27 DIAGNOSIS — O2690 Pregnancy related conditions, unspecified, unspecified trimester: Secondary | ICD-10-CM

## 2022-12-02 ENCOUNTER — Other Ambulatory Visit: Payer: Self-pay

## 2022-12-02 ENCOUNTER — Ambulatory Visit: Payer: No Typology Code available for payment source | Attending: Obstetrics

## 2022-12-02 DIAGNOSIS — Z3A28 28 weeks gestation of pregnancy: Secondary | ICD-10-CM | POA: Insufficient documentation

## 2022-12-02 DIAGNOSIS — O99323 Drug use complicating pregnancy, third trimester: Secondary | ICD-10-CM | POA: Diagnosis not present

## 2022-12-02 DIAGNOSIS — O99313 Alcohol use complicating pregnancy, third trimester: Secondary | ICD-10-CM

## 2022-12-02 DIAGNOSIS — O2690 Pregnancy related conditions, unspecified, unspecified trimester: Secondary | ICD-10-CM

## 2022-12-02 DIAGNOSIS — O99891 Other specified diseases and conditions complicating pregnancy: Secondary | ICD-10-CM | POA: Diagnosis not present

## 2022-12-02 DIAGNOSIS — F1910 Other psychoactive substance abuse, uncomplicated: Secondary | ICD-10-CM | POA: Diagnosis not present

## 2022-12-02 DIAGNOSIS — F1090 Alcohol use, unspecified, uncomplicated: Secondary | ICD-10-CM | POA: Diagnosis not present

## 2022-12-21 DIAGNOSIS — O0993 Supervision of high risk pregnancy, unspecified, third trimester: Secondary | ICD-10-CM | POA: Insufficient documentation

## 2022-12-22 DIAGNOSIS — O0993 Supervision of high risk pregnancy, unspecified, third trimester: Secondary | ICD-10-CM | POA: Diagnosis not present

## 2022-12-22 DIAGNOSIS — T40411A Poisoning by fentanyl or fentanyl analogs, accidental (unintentional), initial encounter: Secondary | ICD-10-CM | POA: Diagnosis not present

## 2022-12-22 DIAGNOSIS — Z8781 Personal history of (healed) traumatic fracture: Secondary | ICD-10-CM | POA: Diagnosis not present

## 2022-12-22 DIAGNOSIS — Z91410 Personal history of adult physical and sexual abuse: Secondary | ICD-10-CM | POA: Diagnosis not present

## 2022-12-22 DIAGNOSIS — Z720 Tobacco use: Secondary | ICD-10-CM | POA: Diagnosis not present

## 2022-12-22 DIAGNOSIS — F419 Anxiety disorder, unspecified: Secondary | ICD-10-CM | POA: Diagnosis not present

## 2023-01-05 DIAGNOSIS — O0993 Supervision of high risk pregnancy, unspecified, third trimester: Secondary | ICD-10-CM | POA: Diagnosis not present

## 2023-01-05 DIAGNOSIS — Z23 Encounter for immunization: Secondary | ICD-10-CM | POA: Diagnosis not present

## 2023-01-05 DIAGNOSIS — R8290 Unspecified abnormal findings in urine: Secondary | ICD-10-CM | POA: Diagnosis not present

## 2023-01-12 ENCOUNTER — Other Ambulatory Visit: Payer: Self-pay

## 2023-01-12 DIAGNOSIS — Z8781 Personal history of (healed) traumatic fracture: Secondary | ICD-10-CM

## 2023-01-12 DIAGNOSIS — F1991 Other psychoactive substance use, unspecified, in remission: Secondary | ICD-10-CM

## 2023-01-13 ENCOUNTER — Ambulatory Visit: Payer: 59 | Attending: Maternal & Fetal Medicine

## 2023-01-13 ENCOUNTER — Other Ambulatory Visit: Payer: Self-pay

## 2023-01-13 DIAGNOSIS — F1090 Alcohol use, unspecified, uncomplicated: Secondary | ICD-10-CM | POA: Diagnosis not present

## 2023-01-13 DIAGNOSIS — Z3A34 34 weeks gestation of pregnancy: Secondary | ICD-10-CM | POA: Diagnosis not present

## 2023-01-13 DIAGNOSIS — O26893 Other specified pregnancy related conditions, third trimester: Secondary | ICD-10-CM | POA: Diagnosis not present

## 2023-01-13 DIAGNOSIS — Z8781 Personal history of (healed) traumatic fracture: Secondary | ICD-10-CM

## 2023-01-13 DIAGNOSIS — O99313 Alcohol use complicating pregnancy, third trimester: Secondary | ICD-10-CM | POA: Diagnosis not present

## 2023-01-13 DIAGNOSIS — O99323 Drug use complicating pregnancy, third trimester: Secondary | ICD-10-CM

## 2023-01-13 DIAGNOSIS — F1991 Other psychoactive substance use, unspecified, in remission: Secondary | ICD-10-CM

## 2023-01-13 DIAGNOSIS — F1910 Other psychoactive substance abuse, uncomplicated: Secondary | ICD-10-CM

## 2023-02-03 DIAGNOSIS — O0930 Supervision of pregnancy with insufficient antenatal care, unspecified trimester: Secondary | ICD-10-CM | POA: Diagnosis not present

## 2023-02-03 DIAGNOSIS — Z3483 Encounter for supervision of other normal pregnancy, third trimester: Secondary | ICD-10-CM | POA: Diagnosis not present

## 2023-02-06 ENCOUNTER — Inpatient Hospital Stay: Payer: No Typology Code available for payment source | Admitting: Certified Registered"

## 2023-02-06 ENCOUNTER — Inpatient Hospital Stay
Admission: EM | Admit: 2023-02-06 | Discharge: 2023-02-08 | DRG: 806 | Disposition: A | Discharge status: Home / Self Care | Payer: 59 | Attending: Obstetrics | Admitting: Obstetrics

## 2023-02-06 ENCOUNTER — Other Ambulatory Visit: Payer: Self-pay

## 2023-02-06 ENCOUNTER — Encounter: Payer: Self-pay | Admitting: Obstetrics and Gynecology

## 2023-02-06 ENCOUNTER — Inpatient Hospital Stay: Payer: 59 | Admitting: Certified Registered"

## 2023-02-06 DIAGNOSIS — O9832 Other infections with a predominantly sexual mode of transmission complicating childbirth: Secondary | ICD-10-CM | POA: Diagnosis not present

## 2023-02-06 DIAGNOSIS — F419 Anxiety disorder, unspecified: Secondary | ICD-10-CM | POA: Diagnosis not present

## 2023-02-06 DIAGNOSIS — O0993 Supervision of high risk pregnancy, unspecified, third trimester: Secondary | ICD-10-CM | POA: Diagnosis not present

## 2023-02-06 DIAGNOSIS — A6000 Herpesviral infection of urogenital system, unspecified: Secondary | ICD-10-CM | POA: Diagnosis present

## 2023-02-06 DIAGNOSIS — Z87410 Personal history of cervical dysplasia: Secondary | ICD-10-CM | POA: Diagnosis not present

## 2023-02-06 DIAGNOSIS — O99344 Other mental disorders complicating childbirth: Secondary | ICD-10-CM | POA: Diagnosis not present

## 2023-02-06 DIAGNOSIS — O26893 Other specified pregnancy related conditions, third trimester: Secondary | ICD-10-CM | POA: Diagnosis not present

## 2023-02-06 DIAGNOSIS — O9902 Anemia complicating childbirth: Secondary | ICD-10-CM | POA: Diagnosis not present

## 2023-02-06 DIAGNOSIS — Z8249 Family history of ischemic heart disease and other diseases of the circulatory system: Secondary | ICD-10-CM | POA: Diagnosis not present

## 2023-02-06 DIAGNOSIS — Z87891 Personal history of nicotine dependence: Secondary | ICD-10-CM | POA: Diagnosis not present

## 2023-02-06 DIAGNOSIS — Z8781 Personal history of (healed) traumatic fracture: Secondary | ICD-10-CM

## 2023-02-06 DIAGNOSIS — Z3A37 37 weeks gestation of pregnancy: Secondary | ICD-10-CM

## 2023-02-06 DIAGNOSIS — Z23 Encounter for immunization: Secondary | ICD-10-CM | POA: Diagnosis not present

## 2023-02-06 LAB — TYPE AND SCREEN
ABO/RH(D): B POS
Antibody Screen: NEGATIVE

## 2023-02-06 LAB — CBC
HCT: 30.4 % — ABNORMAL LOW (ref 36.0–46.0)
Hemoglobin: 10.4 g/dL — ABNORMAL LOW (ref 12.0–15.0)
MCH: 31 pg (ref 26.0–34.0)
MCHC: 34.2 g/dL (ref 30.0–36.0)
MCV: 90.5 fL (ref 80.0–100.0)
Platelets: 94 10*3/uL — ABNORMAL LOW (ref 150–400)
RBC: 3.36 MIL/uL — ABNORMAL LOW (ref 3.87–5.11)
RDW: 12.3 % (ref 11.5–15.5)
WBC: 6 10*3/uL (ref 4.0–10.5)
nRBC: 0 % (ref 0.0–0.2)

## 2023-02-06 LAB — URINE DRUG SCREEN, QUALITATIVE (ARMC ONLY)
Amphetamines, Ur Screen: NOT DETECTED
Barbiturates, Ur Screen: NOT DETECTED
Benzodiazepine, Ur Scrn: NOT DETECTED
Cannabinoid 50 Ng, Ur ~~LOC~~: NOT DETECTED
Cocaine Metabolite,Ur ~~LOC~~: NOT DETECTED
MDMA (Ecstasy)Ur Screen: NOT DETECTED
Methadone Scn, Ur: NOT DETECTED
Opiate, Ur Screen: NOT DETECTED
Phencyclidine (PCP) Ur S: NOT DETECTED
Tricyclic, Ur Screen: NOT DETECTED

## 2023-02-06 LAB — PROTEIN / CREATININE RATIO, URINE
Creatinine, Urine: 104 mg/dL
Protein Creatinine Ratio: 0.1 mg/mg{Cre} (ref 0.00–0.15)
Total Protein, Urine: 10 mg/dL

## 2023-02-06 LAB — COMPREHENSIVE METABOLIC PANEL
ALT: 12 U/L (ref 0–44)
AST: 18 U/L (ref 15–41)
Albumin: 2.8 g/dL — ABNORMAL LOW (ref 3.5–5.0)
Alkaline Phosphatase: 140 U/L — ABNORMAL HIGH (ref 38–126)
Anion gap: 6 (ref 5–15)
BUN: 9 mg/dL (ref 6–20)
CO2: 21 mmol/L — ABNORMAL LOW (ref 22–32)
Calcium: 8 mg/dL — ABNORMAL LOW (ref 8.9–10.3)
Chloride: 110 mmol/L (ref 98–111)
Creatinine, Ser: 0.65 mg/dL (ref 0.44–1.00)
GFR, Estimated: >60 mL/min (ref >60)
Glucose, Bld: 87 mg/dL (ref 70–99)
Potassium: 3.6 mmol/L (ref 3.5–5.1)
Sodium: 137 mmol/L (ref 135–145)
Total Bilirubin: 0.4 mg/dL (ref 0.3–1.2)
Total Protein: 5.8 g/dL — ABNORMAL LOW (ref 6.5–8.1)

## 2023-02-06 MED ORDER — SOD CITRATE-CITRIC ACID 500-334 MG/5ML PO SOLN: 30.0000 mL | ORAL | Status: DC | PRN

## 2023-02-06 MED ORDER — ONDANSETRON HCL 4 MG/2ML IJ SOLN
4.0000 mg | Freq: Four times a day (QID) | INTRAMUSCULAR | Status: DC | PRN
  Administered 2023-02-07: 4 mg via INTRAVENOUS
  Filled 2023-02-06: qty 2

## 2023-02-06 MED ORDER — EPHEDRINE 5 MG/ML INJ: 10.0000 mg | INTRAVENOUS | Status: DC | PRN

## 2023-02-06 MED ORDER — TERBUTALINE SULFATE 1 MG/ML IJ SOLN
0.2500 mg | Freq: Once | INTRAMUSCULAR | Status: AC | PRN
  Administered 2023-02-06: 0.25 mg via SUBCUTANEOUS
  Filled 2023-02-06: qty 1

## 2023-02-06 MED ORDER — LACTATED RINGERS IV SOLN: 500.0000 mL | Freq: Once | INTRAVENOUS | Status: DC

## 2023-02-06 MED ORDER — OXYTOCIN-SODIUM CHLORIDE 30-0.9 UT/500ML-% IV SOLN
2.5000 [IU]/h | INTRAVENOUS | Status: DC
  Administered 2023-02-07: 2.5 [IU]/h via INTRAVENOUS

## 2023-02-06 MED ORDER — ACETAMINOPHEN 325 MG PO TABS
650.0000 mg | ORAL_TABLET | ORAL | Status: DC | PRN
  Filled 2023-02-06: qty 2

## 2023-02-06 MED ORDER — OXYTOCIN-SODIUM CHLORIDE 30-0.9 UT/500ML-% IV SOLN
1.0000 m[IU]/min | INTRAVENOUS | Status: DC
  Administered 2023-02-06 (×2): 2 m[IU]/min via INTRAVENOUS
  Filled 2023-02-06: qty 500

## 2023-02-06 MED ORDER — FENTANYL CITRATE (PF) 100 MCG/2ML IJ SOLN
50.0000 ug | INTRAMUSCULAR | Status: DC | PRN
  Administered 2023-02-06: 100 ug via INTRAVENOUS
  Filled 2023-02-06: qty 2

## 2023-02-06 MED ORDER — LACTATED RINGERS AMNIOINFUSION
INTRAVENOUS | Status: DC
  Filled 2023-02-06 (×2): qty 1000

## 2023-02-06 MED ORDER — LACTATED RINGERS IV SOLN: INTRAVENOUS | Status: DC

## 2023-02-06 MED ORDER — LIDOCAINE-EPINEPHRINE (PF) 1.5 %-1:200000 IJ SOLN
INTRAMUSCULAR | Status: DC | PRN
  Administered 2023-02-06: 3 mL via EPIDURAL

## 2023-02-06 MED ORDER — DIPHENHYDRAMINE HCL 50 MG/ML IJ SOLN
12.5000 mg | INTRAMUSCULAR | Status: DC | PRN
  Filled 2023-02-06: qty 1

## 2023-02-06 MED ORDER — LIDOCAINE HCL (PF) 1 % IJ SOLN
30.0000 mL | INTRAMUSCULAR | Status: AC | PRN
  Administered 2023-02-07: 30 mL via SUBCUTANEOUS

## 2023-02-06 MED ORDER — DIPHENHYDRAMINE HCL 50 MG/ML IJ SOLN
50.0000 mg | Freq: Once | INTRAMUSCULAR | Status: AC
  Administered 2023-02-06: 50 mg via INTRAVENOUS

## 2023-02-06 MED ORDER — PHENYLEPHRINE 80 MCG/ML (10ML) SYRINGE FOR IV PUSH (FOR BLOOD PRESSURE SUPPORT): 80.0000 ug | PREFILLED_SYRINGE | INTRAVENOUS | Status: DC | PRN

## 2023-02-06 MED ORDER — LACTATED RINGERS IV SOLN
500.0000 mL | INTRAVENOUS | Status: DC | PRN
  Administered 2023-02-06: 1000 mL via INTRAVENOUS
  Administered 2023-02-06: 500 mL via INTRAVENOUS

## 2023-02-06 MED ORDER — LIDOCAINE HCL (PF) 1 % IJ SOLN
INTRAMUSCULAR | Status: DC | PRN
  Administered 2023-02-06: 2 mL

## 2023-02-06 MED ORDER — FENTANYL-BUPIVACAINE-NACL 0.5-0.125-0.9 MG/250ML-% EP SOLN
12.0000 mL/h | EPIDURAL | Status: DC | PRN
  Administered 2023-02-06: 12 mL/h via EPIDURAL
  Filled 2023-02-06: qty 250

## 2023-02-06 MED ORDER — OXYTOCIN BOLUS FROM INFUSION
333.0000 mL | Freq: Once | INTRAVENOUS | Status: AC
  Administered 2023-02-07: 333 mL via INTRAVENOUS
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
## 2023-02-06 NOTE — OB Triage Note (Signed)
Patient arrived to LDR 5 with c/o leaking of fluid since approx 1140. Reported clear fluid with no color or odor. Positive fetal movement. Occasional contractions/tightness noted.

## 2023-02-06 NOTE — Progress Notes (Signed)
L&D Note    Subjective:  has no unusual complaints  Objective:   Vitals:   02/06/23 1941 02/06/23 1942 02/06/23 2003 02/06/23 2146  BP: (!) 141/90 (!) 122/91 126/63   Pulse: 72 (!) 144    Resp:      Temp:    97.9 F (36.6 C)  TempSrc:    Oral  SpO2:      Weight:      Height:        Current Vital Signs 24h Vital Sign Ranges  T 97.9 F (36.6 C) Temp  Avg: 98.3 F (36.8 C)  Min: 97.9 F (36.6 C)  Max: 98.5 F (36.9 C)  BP 126/63 BP  Min: 122/91  Max: 156/118  HR (!) 144 Pulse  Avg: 105.6  Min: 65  Max: 240  RR 18 Resp  Avg: 18  Min: 18  Max: 18  SaO2 99 %   SpO2  Avg: 99 %  Min: 99 %  Max: 99 %      Gen: alert, cooperative, no distress FHR: Baseline: 135 bpm, Variability: moderate, Accels: Present, Decels: intermittent variables Toco: regular, every 1.5-3 minutes SVE: Dilation: 9 Effacement (%): 80 Cervical Position: Middle Station: Plus 1 Presentation: Vertex Exam by:: Devinn Hurwitz,CNM  Medications SCHEDULED MEDICATIONS   oxytocin 40 units in LR 1000 mL  333 mL Intravenous Once    MEDICATION INFUSIONS   fentaNYL 2 mcg/mL w/bupivacaine 0.125% in NS 250 mL 12 mL/hr (02/06/23 1927)   lactated ringers     lactated ringers     lactated ringers 500 mL (02/06/23 1940)   lactated ringers 125 mL/hr at 02/06/23 1803   oxytocin     oxytocin 2 milli-units/min (02/06/23 2154)    PRN MEDICATIONS  acetaminophen, diphenhydrAMINE, ePHEDrine, ePHEDrine, fentaNYL (SUBLIMAZE) injection, fentaNYL 2 mcg/mL w/bupivacaine 0.125% in NS 250 mL, lactated ringers, lidocaine (PF), ondansetron, phenylephrine, phenylephrine, sodium citrate-citric acid   Assessment & Plan:  27 y.o. G1P0000 at [redacted]w[redacted]d admitted for SROM -Labor: Active phase labor. and Satisfactory labor progress. -Fetal Well-being: Category II -GBS: negative -Membranes ruptured, clear fluid -Continue present management. and Intervention: IV Pitocin augmentation, change maternal position, and anticipate vaginal  delivery -Analgesia: regional anesthesia   Bayard More Wonda Amis, CNM  02/06/2023 10:40 PM  [facility]

## 2023-02-06 NOTE — Anesthesia Procedure Notes (Signed)
Epidural Patient location during procedure: OB Start time: 02/06/2023 7:19 PM End time: 02/06/2023 7:27 PM  Staffing Anesthesiologist: Foye Deer, MD Performed: anesthesiologist   Preanesthetic Checklist Completed: patient identified, IV checked, site marked, risks and benefits discussed, surgical consent, monitors and equipment checked, pre-op evaluation and timeout performed  Epidural Patient position: sitting Prep: ChloraPrep Patient monitoring: heart rate, continuous pulse ox and blood pressure Approach: midline Location: L3-L4 Injection technique: LOR saline  Needle:  Needle type: Tuohy  Needle gauge: 18 G Needle length: 9 cm Needle insertion depth: 5 cm Catheter type: closed end Catheter size: 20 Guage Catheter at skin depth: 10 cm Test dose: negative and 1.5% lidocaine with Epi 1:200 K  Assessment Events: blood not aspirated, no cerebrospinal fluid, injection not painful, no injection resistance and no paresthesia  Additional Notes Reason for block:procedure for pain
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
## 2023-02-06 NOTE — Progress Notes (Signed)
L&D Note    Subjective:  has no unusual complaints  Objective:   Vitals:   02/06/23 1935 02/06/23 1938 02/06/23 1941 02/06/23 1942  BP: 125/76 128/60 (!) 141/90 (!) 122/91  Pulse:  (!) 116 72 (!) 144  Resp:      Temp:      TempSrc:      SpO2:      Weight:      Height:        Current Vital Signs 24h Vital Sign Ranges  T 98.3 F (36.8 C) Temp  Avg: 98.4 F (36.9 C)  Min: 98.3 F (36.8 C)  Max: 98.5 F (36.9 C)  BP (!) 122/91 BP  Min: 122/91  Max: 156/118  HR (!) 144 Pulse  Avg: 105.6  Min: 65  Max: 240  RR 18 Resp  Avg: 18  Min: 18  Max: 18  SaO2 99 %   SpO2  Avg: 99 %  Min: 99 %  Max: 99 %      Gen: alert, cooperative, no distress FHR: Baseline: 145 bpm, Variability: moderate, Accels: Abscent, Decels: early and prolonged decel Toco: regular, every 2-3 minutes SVE: Dilation: 6 Effacement (%): 80 Cervical Position: Middle Station: Plus 1 Presentation: Vertex Exam by:: Katharina Caper, CNM  Medications SCHEDULED MEDICATIONS   oxytocin 40 units in LR 1000 mL  333 mL Intravenous Once    MEDICATION INFUSIONS   fentaNYL 2 mcg/mL w/bupivacaine 0.125% in NS 250 mL 12 mL/hr (02/06/23 1927)   lactated ringers     lactated ringers 500 mL (02/06/23 1940)   lactated ringers 125 mL/hr at 02/06/23 1803   oxytocin     oxytocin Stopped (02/06/23 1937)    PRN MEDICATIONS  acetaminophen, diphenhydrAMINE, ePHEDrine, ePHEDrine, fentaNYL (SUBLIMAZE) injection, fentaNYL 2 mcg/mL w/bupivacaine 0.125% in NS 250 mL, lactated ringers, lidocaine (PF), ondansetron, phenylephrine, phenylephrine, sodium citrate-citric acid, terbutaline   Assessment & Plan:  28 y.o. G1P0000 at [redacted]w[redacted]d admitted for SROM -Labor: Active phase labor., Adequate uterine activity - intensity and frequency., and Satisfactory labor progress. -Fetal Well-being: Category II -GBS: negative -Membranes ruptured, clear fluid, sterile speculum exam no evidence of herpetic lesions -Intervention: IV fluid bolus, change maternal  position, reduce stimulation (IV Pitocin), anticipate vaginal delivery, and FSE applied -Analgesia: regional anesthesia   Michele Hunt, CNM  02/06/2023 7:54 PM  [facility]

## 2023-02-06 NOTE — Progress Notes (Signed)
EFM monitors removed temporarily for epidural placement. FHT regular and within normal limits before removal.

## 2023-02-07 ENCOUNTER — Encounter: Payer: Self-pay | Admitting: Obstetrics and Gynecology

## 2023-02-07 LAB — CBC
HCT: 31.9 % — ABNORMAL LOW (ref 36.0–46.0)
Hemoglobin: 11.1 g/dL — ABNORMAL LOW (ref 12.0–15.0)
MCH: 30.9 pg (ref 26.0–34.0)
MCHC: 34.8 g/dL (ref 30.0–36.0)
MCV: 88.9 fL (ref 80.0–100.0)
Platelets: 86 10*3/uL — ABNORMAL LOW (ref 150–400)
RBC: 3.59 MIL/uL — ABNORMAL LOW (ref 3.87–5.11)
RDW: 12.3 % (ref 11.5–15.5)
WBC: 11.5 10*3/uL — ABNORMAL HIGH (ref 4.0–10.5)
nRBC: 0 % (ref 0.0–0.2)

## 2023-02-07 LAB — RPR: RPR Ser Ql: NONREACTIVE

## 2023-02-07 MED ORDER — FLEET ENEMA RE ENEM: 1.0000 | ENEMA | Freq: Every day | RECTAL | Status: DC | PRN

## 2023-02-07 MED ORDER — ACETAMINOPHEN 325 MG PO TABS
650.0000 mg | ORAL_TABLET | ORAL | Status: DC | PRN
  Administered 2023-02-07 – 2023-02-08 (×5): 650 mg via ORAL
  Filled 2023-02-07 (×4): qty 2

## 2023-02-07 MED ORDER — SENNOSIDES-DOCUSATE SODIUM 8.6-50 MG PO TABS: 2.0000 | ORAL_TABLET | ORAL | Status: DC

## 2023-02-07 MED ORDER — DIBUCAINE (PERIANAL) 1 % EX OINT: 1.0000 | TOPICAL_OINTMENT | CUTANEOUS | Status: DC | PRN

## 2023-02-07 MED ORDER — PRENATAL MULTIVITAMIN CH
1.0000 | ORAL_TABLET | Freq: Every day | ORAL | Status: DC
  Administered 2023-02-07 – 2023-02-08 (×2): 1 via ORAL
  Filled 2023-02-07 (×2): qty 1

## 2023-02-07 MED ORDER — ONDANSETRON HCL 4 MG PO TABS: 4.0000 mg | ORAL_TABLET | ORAL | Status: DC | PRN

## 2023-02-07 MED ORDER — SODIUM CHLORIDE 0.9% FLUSH: 3.0000 mL | INTRAVENOUS | Status: DC | PRN

## 2023-02-07 MED ORDER — FUROSEMIDE 10 MG/ML IJ SOLN
20.0000 mg | Freq: Once | INTRAMUSCULAR | Status: AC
  Administered 2023-02-07: 20 mg via INTRAVENOUS
  Filled 2023-02-07: qty 2

## 2023-02-07 MED ORDER — WITCH HAZEL-GLYCERIN EX PADS: 1.0000 | MEDICATED_PAD | CUTANEOUS | Status: DC | PRN

## 2023-02-07 MED ORDER — ONDANSETRON HCL 4 MG/2ML IJ SOLN: 4.0000 mg | INTRAMUSCULAR | Status: DC | PRN

## 2023-02-07 MED ORDER — SODIUM CHLORIDE 0.9% FLUSH
3.0000 mL | Freq: Two times a day (BID) | INTRAVENOUS | Status: DC
  Administered 2023-02-07: 3 mL via INTRAVENOUS

## 2023-02-07 MED ORDER — OXYCODONE HCL 5 MG PO TABS
5.0000 mg | ORAL_TABLET | ORAL | Status: DC | PRN
  Administered 2023-02-07: 5 mg via ORAL
  Filled 2023-02-07: qty 1

## 2023-02-07 MED ORDER — SIMETHICONE 80 MG PO CHEW: 80.0000 mg | CHEWABLE_TABLET | ORAL | Status: DC | PRN

## 2023-02-07 MED ORDER — SODIUM CHLORIDE 0.9 % IV SOLN: 250.0000 mL | INTRAVENOUS | Status: DC | PRN

## 2023-02-07 MED ORDER — TRANEXAMIC ACID-NACL 1000-0.7 MG/100ML-% IV SOLN
INTRAVENOUS | Status: AC
  Filled 2023-02-07: qty 100

## 2023-02-07 MED ORDER — IBUPROFEN 600 MG PO TABS
600.0000 mg | ORAL_TABLET | Freq: Four times a day (QID) | ORAL | Status: DC
  Administered 2023-02-07 – 2023-02-08 (×6): 600 mg via ORAL
  Filled 2023-02-07 (×6): qty 1

## 2023-02-07 MED ORDER — BENZOCAINE-MENTHOL 20-0.5 % EX AERO
1.0000 | INHALATION_SPRAY | CUTANEOUS | Status: DC | PRN
  Administered 2023-02-07: 1 via TOPICAL
  Filled 2023-02-07 (×2): qty 56

## 2023-02-07 MED ORDER — COCONUT OIL OIL
1.0000 | TOPICAL_OIL | Status: DC | PRN
  Administered 2023-02-07: 1 via TOPICAL
  Filled 2023-02-07: qty 7.5

## 2023-02-07 MED ORDER — DIPHENHYDRAMINE HCL 25 MG PO CAPS: 25.0000 mg | ORAL_CAPSULE | Freq: Four times a day (QID) | ORAL | Status: DC | PRN

## 2023-02-07 MED ORDER — METHYLERGONOVINE MALEATE 0.2 MG/ML IJ SOLN
INTRAMUSCULAR | Status: AC
  Administered 2023-02-07: 0.2 mg
  Filled 2023-02-07: qty 1

## 2023-02-07 MED ORDER — ZOLPIDEM TARTRATE 5 MG PO TABS: 5.0000 mg | ORAL_TABLET | Freq: Every evening | ORAL | Status: DC | PRN

## 2023-02-07 MED ORDER — MEDROXYPROGESTERONE ACETATE 150 MG/ML IM SUSP
150.0000 mg | INTRAMUSCULAR | Status: DC | PRN
  Filled 2023-02-07: qty 1

## 2023-02-07 MED ORDER — BISACODYL 10 MG RE SUPP: 10.0000 mg | Freq: Every day | RECTAL | Status: DC | PRN
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
## 2023-02-07 NOTE — Anesthesia Postprocedure Evaluation (Signed)
Anesthesia Post Note  Patient: Halford Chessman  Procedure(s) Performed: AN AD HOC LABOR EPIDURAL  Patient location during evaluation: Mother Baby Anesthesia Type: Epidural Level of consciousness: awake and alert Pain management: pain level controlled Vital Signs Assessment: post-procedure vital signs reviewed and stable Respiratory status: spontaneous breathing, nonlabored ventilation and respiratory function stable Cardiovascular status: stable Postop Assessment: no headache, no backache, patient able to bend at knees and able to ambulate Anesthetic complications: no   No notable events documented.   Last Vitals:  Vitals:   02/07/23 0530 02/07/23 0649  BP: (!) 144/82 129/80  Pulse: (!) 59 62  Resp: 18 18  Temp: 37.5 C 37.2 C  SpO2: 100%     Last Pain:  Vitals:   02/07/23 0530  TempSrc: Oral  PainSc: 0-No pain                 Cleda Mccreedy Christiona Siddique

## 2023-02-08 LAB — COMPREHENSIVE METABOLIC PANEL
ALT: 16 U/L (ref 0–44)
AST: 35 U/L (ref 15–41)
Albumin: 2.6 g/dL — ABNORMAL LOW (ref 3.5–5.0)
Alkaline Phosphatase: 125 U/L (ref 38–126)
Anion gap: 6 (ref 5–15)
BUN: 10 mg/dL (ref 6–20)
CO2: 22 mmol/L (ref 22–32)
Calcium: 8.3 mg/dL — ABNORMAL LOW (ref 8.9–10.3)
Chloride: 108 mmol/L (ref 98–111)
Creatinine, Ser: 0.75 mg/dL (ref 0.44–1.00)
GFR, Estimated: >60 mL/min (ref >60)
Glucose, Bld: 92 mg/dL (ref 70–99)
Potassium: 4 mmol/L (ref 3.5–5.1)
Sodium: 136 mmol/L (ref 135–145)
Total Bilirubin: 0.5 mg/dL (ref 0.3–1.2)
Total Protein: 5.8 g/dL — ABNORMAL LOW (ref 6.5–8.1)

## 2023-02-08 LAB — CBC WITH DIFFERENTIAL/PLATELET
Abs Immature Granulocytes: 0.04 10*3/uL (ref 0.00–0.07)
Basophils Absolute: 0 10*3/uL (ref 0.0–0.1)
Basophils Relative: 0 %
Eosinophils Absolute: 0.1 10*3/uL (ref 0.0–0.5)
Eosinophils Relative: 1 %
HCT: 32.5 % — ABNORMAL LOW (ref 36.0–46.0)
Hemoglobin: 11 g/dL — ABNORMAL LOW (ref 12.0–15.0)
Immature Granulocytes: 0 %
Lymphocytes Relative: 13 %
Lymphs Abs: 1.2 10*3/uL (ref 0.7–4.0)
MCH: 30.7 pg (ref 26.0–34.0)
MCHC: 33.8 g/dL (ref 30.0–36.0)
MCV: 90.8 fL (ref 80.0–100.0)
Monocytes Absolute: 0.8 10*3/uL (ref 0.1–1.0)
Monocytes Relative: 8 %
Neutro Abs: 7.7 10*3/uL (ref 1.7–7.7)
Neutrophils Relative %: 78 %
Platelets: 104 10*3/uL — ABNORMAL LOW (ref 150–400)
RBC: 3.58 MIL/uL — ABNORMAL LOW (ref 3.87–5.11)
RDW: 12.5 % (ref 11.5–15.5)
WBC: 9.8 10*3/uL (ref 4.0–10.5)
nRBC: 0 % (ref 0.0–0.2)

## 2023-02-08 LAB — PROTEIN / CREATININE RATIO, URINE
Creatinine, Urine: 53 mg/dL
Protein Creatinine Ratio: 0.66 mg/mg{Cre} — ABNORMAL HIGH (ref 0.00–0.15)
Total Protein, Urine: 35 mg/dL

## 2023-02-08 NOTE — Discharge Instructions (Signed)

## 2023-02-08 NOTE — Progress Notes (Signed)
Patient discharged home with family.  Discharge instructions, when to follow up, and prescriptions reviewed with patient.  Patient verbalized understanding. Patient will be escorted out by auxiliary.   
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
## 2023-02-12 ENCOUNTER — Telehealth: Payer: Self-pay

## 2023-02-12 DIAGNOSIS — Z0130 Encounter for examination of blood pressure without abnormal findings: Secondary | ICD-10-CM | POA: Diagnosis not present

## 2023-02-12 NOTE — Telephone Encounter (Signed)
[facility]- Discharge Call Backs-Left Voicemail about the following below. 1-Do you have any questions or concerns about yourself as you heal? 2-Any concerns or questions about your baby? 3- Reviewed ABC's of safe sleep. 4-How was your stay at the hospital? 5- Did our team work together to care for you? You should be receiving a survey in the mail soon.   We would really appreciate it if you could fill that out for us and return it in the mail.  We value the feedback to make improvements and continue the great work we do.   If you have any questions please feel free to call me back at [phone]  

## 2023-02-18 DIAGNOSIS — F419 Anxiety disorder, unspecified: Secondary | ICD-10-CM | POA: Diagnosis not present

## 2023-02-18 DIAGNOSIS — O99340 Other mental disorders complicating pregnancy, unspecified trimester: Secondary | ICD-10-CM | POA: Diagnosis not present

## 2023-03-22 DIAGNOSIS — Z3042 Encounter for surveillance of injectable contraceptive: Secondary | ICD-10-CM | POA: Diagnosis not present

## 2023-03-22 DIAGNOSIS — Z01812 Encounter for preprocedural laboratory examination: Secondary | ICD-10-CM | POA: Diagnosis not present

## 2023-06-07 DIAGNOSIS — Z3042 Encounter for surveillance of injectable contraceptive: Secondary | ICD-10-CM | POA: Diagnosis not present

## 2023-06-08 DIAGNOSIS — F1120 Opioid dependence, uncomplicated: Secondary | ICD-10-CM | POA: Diagnosis not present

## 2023-06-08 DIAGNOSIS — F1521 Other stimulant dependence, in remission: Secondary | ICD-10-CM | POA: Diagnosis not present

## 2023-06-08 DIAGNOSIS — F17290 Nicotine dependence, other tobacco product, uncomplicated: Secondary | ICD-10-CM | POA: Diagnosis not present

## 2023-06-22 DIAGNOSIS — F17290 Nicotine dependence, other tobacco product, uncomplicated: Secondary | ICD-10-CM | POA: Diagnosis not present

## 2023-06-22 DIAGNOSIS — F1521 Other stimulant dependence, in remission: Secondary | ICD-10-CM | POA: Diagnosis not present

## 2023-06-22 DIAGNOSIS — F1120 Opioid dependence, uncomplicated: Secondary | ICD-10-CM | POA: Diagnosis not present

## 2023-07-06 DIAGNOSIS — F1120 Opioid dependence, uncomplicated: Secondary | ICD-10-CM | POA: Diagnosis not present

## 2023-07-06 DIAGNOSIS — F17290 Nicotine dependence, other tobacco product, uncomplicated: Secondary | ICD-10-CM | POA: Diagnosis not present

## 2023-07-06 DIAGNOSIS — F1521 Other stimulant dependence, in remission: Secondary | ICD-10-CM | POA: Diagnosis not present

## 2023-07-20 DIAGNOSIS — F17290 Nicotine dependence, other tobacco product, uncomplicated: Secondary | ICD-10-CM | POA: Diagnosis not present

## 2023-07-20 DIAGNOSIS — F1120 Opioid dependence, uncomplicated: Secondary | ICD-10-CM | POA: Diagnosis not present

## 2023-07-20 DIAGNOSIS — F1521 Other stimulant dependence, in remission: Secondary | ICD-10-CM | POA: Diagnosis not present

## 2023-08-10 DIAGNOSIS — F17290 Nicotine dependence, other tobacco product, uncomplicated: Secondary | ICD-10-CM | POA: Diagnosis not present

## 2023-08-10 DIAGNOSIS — F1521 Other stimulant dependence, in remission: Secondary | ICD-10-CM | POA: Diagnosis not present

## 2023-08-10 DIAGNOSIS — F1120 Opioid dependence, uncomplicated: Secondary | ICD-10-CM | POA: Diagnosis not present

## 2023-08-30 DIAGNOSIS — Z3042 Encounter for surveillance of injectable contraceptive: Secondary | ICD-10-CM | POA: Diagnosis not present

## 2023-09-04 DIAGNOSIS — F1120 Opioid dependence, uncomplicated: Secondary | ICD-10-CM | POA: Diagnosis not present

## 2023-09-04 DIAGNOSIS — F1521 Other stimulant dependence, in remission: Secondary | ICD-10-CM | POA: Diagnosis not present

## 2023-09-04 DIAGNOSIS — F17290 Nicotine dependence, other tobacco product, uncomplicated: Secondary | ICD-10-CM | POA: Diagnosis not present

## 2023-09-18 DIAGNOSIS — F1120 Opioid dependence, uncomplicated: Secondary | ICD-10-CM | POA: Diagnosis not present

## 2023-09-18 DIAGNOSIS — F17290 Nicotine dependence, other tobacco product, uncomplicated: Secondary | ICD-10-CM | POA: Diagnosis not present

## 2023-09-18 DIAGNOSIS — F1521 Other stimulant dependence, in remission: Secondary | ICD-10-CM | POA: Diagnosis not present

## 2023-10-02 DIAGNOSIS — F17290 Nicotine dependence, other tobacco product, uncomplicated: Secondary | ICD-10-CM | POA: Diagnosis not present

## 2023-10-02 DIAGNOSIS — F1521 Other stimulant dependence, in remission: Secondary | ICD-10-CM | POA: Diagnosis not present

## 2023-10-02 DIAGNOSIS — F1121 Opioid dependence, in remission: Secondary | ICD-10-CM | POA: Diagnosis not present

## 2023-10-16 DIAGNOSIS — F1121 Opioid dependence, in remission: Secondary | ICD-10-CM | POA: Diagnosis not present

## 2023-10-16 DIAGNOSIS — F17290 Nicotine dependence, other tobacco product, uncomplicated: Secondary | ICD-10-CM | POA: Diagnosis not present

## 2023-10-16 DIAGNOSIS — F1521 Other stimulant dependence, in remission: Secondary | ICD-10-CM | POA: Diagnosis not present

## 2023-10-26 DIAGNOSIS — F53 Postpartum depression: Secondary | ICD-10-CM | POA: Diagnosis not present

## 2023-10-30 DIAGNOSIS — F1521 Other stimulant dependence, in remission: Secondary | ICD-10-CM | POA: Diagnosis not present

## 2023-10-30 DIAGNOSIS — F17290 Nicotine dependence, other tobacco product, uncomplicated: Secondary | ICD-10-CM | POA: Diagnosis not present

## 2023-10-30 DIAGNOSIS — F1121 Opioid dependence, in remission: Secondary | ICD-10-CM | POA: Diagnosis not present

## 2023-11-13 DIAGNOSIS — F1121 Opioid dependence, in remission: Secondary | ICD-10-CM | POA: Diagnosis not present

## 2023-11-13 DIAGNOSIS — F1521 Other stimulant dependence, in remission: Secondary | ICD-10-CM | POA: Diagnosis not present

## 2023-11-13 DIAGNOSIS — F17290 Nicotine dependence, other tobacco product, uncomplicated: Secondary | ICD-10-CM | POA: Diagnosis not present

## 2023-11-18 DIAGNOSIS — Z3042 Encounter for surveillance of injectable contraceptive: Secondary | ICD-10-CM | POA: Diagnosis not present

## 2023-11-27 DIAGNOSIS — F17290 Nicotine dependence, other tobacco product, uncomplicated: Secondary | ICD-10-CM | POA: Diagnosis not present

## 2023-11-27 DIAGNOSIS — F1121 Opioid dependence, in remission: Secondary | ICD-10-CM | POA: Diagnosis not present

## 2023-11-27 DIAGNOSIS — F1521 Other stimulant dependence, in remission: Secondary | ICD-10-CM | POA: Diagnosis not present

## 2023-12-18 DIAGNOSIS — F1121 Opioid dependence, in remission: Secondary | ICD-10-CM | POA: Diagnosis not present

## 2023-12-18 DIAGNOSIS — F17290 Nicotine dependence, other tobacco product, uncomplicated: Secondary | ICD-10-CM | POA: Diagnosis not present

## 2023-12-18 DIAGNOSIS — F1521 Other stimulant dependence, in remission: Secondary | ICD-10-CM | POA: Diagnosis not present

## 2023-12-25 DIAGNOSIS — F17290 Nicotine dependence, other tobacco product, uncomplicated: Secondary | ICD-10-CM | POA: Diagnosis not present

## 2023-12-25 DIAGNOSIS — F1521 Other stimulant dependence, in remission: Secondary | ICD-10-CM | POA: Diagnosis not present

## 2023-12-25 DIAGNOSIS — F1121 Opioid dependence, in remission: Secondary | ICD-10-CM | POA: Diagnosis not present

## 2024-01-01 DIAGNOSIS — F1121 Opioid dependence, in remission: Secondary | ICD-10-CM | POA: Diagnosis not present

## 2024-01-01 DIAGNOSIS — F1521 Other stimulant dependence, in remission: Secondary | ICD-10-CM | POA: Diagnosis not present

## 2024-01-01 DIAGNOSIS — F17290 Nicotine dependence, other tobacco product, uncomplicated: Secondary | ICD-10-CM | POA: Diagnosis not present

## 2024-02-12 DIAGNOSIS — F1521 Other stimulant dependence, in remission: Secondary | ICD-10-CM | POA: Diagnosis not present

## 2024-02-12 DIAGNOSIS — F1121 Opioid dependence, in remission: Secondary | ICD-10-CM | POA: Diagnosis not present

## 2024-02-12 DIAGNOSIS — F17290 Nicotine dependence, other tobacco product, uncomplicated: Secondary | ICD-10-CM | POA: Diagnosis not present

## 2024-02-14 DIAGNOSIS — Z3042 Encounter for surveillance of injectable contraceptive: Secondary | ICD-10-CM | POA: Diagnosis not present

## 2024-03-11 DIAGNOSIS — F17290 Nicotine dependence, other tobacco product, uncomplicated: Secondary | ICD-10-CM | POA: Diagnosis not present

## 2024-03-11 DIAGNOSIS — F1121 Opioid dependence, in remission: Secondary | ICD-10-CM | POA: Diagnosis not present

## 2024-03-11 DIAGNOSIS — F1521 Other stimulant dependence, in remission: Secondary | ICD-10-CM | POA: Diagnosis not present

## 2024-03-22 DIAGNOSIS — Z1331 Encounter for screening for depression: Secondary | ICD-10-CM | POA: Diagnosis not present

## 2024-03-22 DIAGNOSIS — F419 Anxiety disorder, unspecified: Secondary | ICD-10-CM | POA: Diagnosis not present

## 2024-03-22 DIAGNOSIS — F32A Depression, unspecified: Secondary | ICD-10-CM | POA: Diagnosis not present

## 2024-03-22 DIAGNOSIS — Z01419 Encounter for gynecological examination (general) (routine) without abnormal findings: Secondary | ICD-10-CM | POA: Diagnosis not present

## 2024-03-25 DIAGNOSIS — F1121 Opioid dependence, in remission: Secondary | ICD-10-CM | POA: Diagnosis not present

## 2024-03-25 DIAGNOSIS — F1521 Other stimulant dependence, in remission: Secondary | ICD-10-CM | POA: Diagnosis not present

## 2024-03-25 DIAGNOSIS — F17290 Nicotine dependence, other tobacco product, uncomplicated: Secondary | ICD-10-CM | POA: Diagnosis not present

## 2024-04-01 DIAGNOSIS — F17290 Nicotine dependence, other tobacco product, uncomplicated: Secondary | ICD-10-CM | POA: Diagnosis not present

## 2024-04-01 DIAGNOSIS — F1121 Opioid dependence, in remission: Secondary | ICD-10-CM | POA: Diagnosis not present

## 2024-04-01 DIAGNOSIS — F1521 Other stimulant dependence, in remission: Secondary | ICD-10-CM | POA: Diagnosis not present
